# Patient Record
Sex: Female | Born: 1945 | Race: White | Hispanic: No | Marital: Single | Smoking: Never smoker
Health system: Southern US, Community
[De-identification: ages and names within clinical notes are randomized; demographics above are authoritative.]

## PROBLEM LIST (undated history)

## (undated) DIAGNOSIS — C541 Malignant neoplasm of endometrium: Secondary | ICD-10-CM

## (undated) DIAGNOSIS — G629 Polyneuropathy, unspecified: Secondary | ICD-10-CM

## (undated) DIAGNOSIS — H409 Unspecified glaucoma: Secondary | ICD-10-CM

## (undated) DIAGNOSIS — E538 Deficiency of other specified B group vitamins: Secondary | ICD-10-CM

## (undated) DIAGNOSIS — K829 Disease of gallbladder, unspecified: Secondary | ICD-10-CM

## (undated) DIAGNOSIS — H8110 Benign paroxysmal vertigo, unspecified ear: Secondary | ICD-10-CM

## (undated) HISTORY — DX: Malignant neoplasm of endometrium: C54.1

## (undated) HISTORY — DX: Disease of gallbladder, unspecified: K82.9

## (undated) HISTORY — DX: Polyneuropathy, unspecified: G62.9

## (undated) HISTORY — PX: TONSILLECTOMY: SUR1361

## (undated) HISTORY — DX: Unspecified glaucoma: H40.9

## (undated) HISTORY — DX: Benign paroxysmal vertigo, unspecified ear: H81.10

## (undated) HISTORY — PX: OOPHORECTOMY: SHX86

## (undated) HISTORY — DX: Deficiency of other specified B group vitamins: E53.8

---

## 2006-06-23 ENCOUNTER — Ambulatory Visit: Payer: Self-pay | Admitting: Emergency Medicine

## 2008-02-15 DIAGNOSIS — C541 Malignant neoplasm of endometrium: Secondary | ICD-10-CM

## 2008-02-15 HISTORY — PX: CHOLECYSTECTOMY: SHX55

## 2008-02-15 HISTORY — PX: ABDOMINAL HYSTERECTOMY: SHX81

## 2008-02-15 HISTORY — DX: Malignant neoplasm of endometrium: C54.1

## 2008-06-29 ENCOUNTER — Ambulatory Visit: Payer: Self-pay | Admitting: Internal Medicine

## 2008-07-01 ENCOUNTER — Ambulatory Visit: Payer: Self-pay | Admitting: Internal Medicine

## 2008-10-14 ENCOUNTER — Ambulatory Visit: Payer: Self-pay | Admitting: Unknown Physician Specialty

## 2008-10-15 ENCOUNTER — Ambulatory Visit: Payer: Self-pay | Admitting: Gynecologic Oncology

## 2008-10-17 ENCOUNTER — Ambulatory Visit: Payer: Self-pay | Admitting: Unknown Physician Specialty

## 2008-10-22 ENCOUNTER — Ambulatory Visit: Payer: Self-pay | Admitting: Unknown Physician Specialty

## 2008-10-28 ENCOUNTER — Ambulatory Visit: Payer: Self-pay | Admitting: Gynecologic Oncology

## 2008-11-14 ENCOUNTER — Ambulatory Visit: Payer: Self-pay | Admitting: Gynecologic Oncology

## 2008-11-25 ENCOUNTER — Ambulatory Visit: Payer: Self-pay | Admitting: Gynecologic Oncology

## 2008-12-15 ENCOUNTER — Ambulatory Visit: Payer: Self-pay | Admitting: Gynecologic Oncology

## 2009-01-05 ENCOUNTER — Emergency Department: Payer: Self-pay | Admitting: Emergency Medicine

## 2009-01-06 ENCOUNTER — Inpatient Hospital Stay: Payer: Self-pay | Admitting: General Surgery

## 2009-01-06 ENCOUNTER — Ambulatory Visit: Payer: Self-pay | Admitting: Gynecologic Oncology

## 2009-04-09 ENCOUNTER — Ambulatory Visit: Payer: Self-pay | Admitting: Internal Medicine

## 2009-04-13 ENCOUNTER — Ambulatory Visit: Payer: Self-pay | Admitting: Internal Medicine

## 2009-04-14 ENCOUNTER — Ambulatory Visit: Payer: Self-pay | Admitting: Gynecologic Oncology

## 2009-04-23 ENCOUNTER — Ambulatory Visit: Payer: Self-pay | Admitting: Internal Medicine

## 2009-05-05 ENCOUNTER — Ambulatory Visit: Payer: Self-pay | Admitting: Gynecologic Oncology

## 2009-05-15 ENCOUNTER — Ambulatory Visit: Payer: Self-pay | Admitting: Gynecologic Oncology

## 2009-05-15 ENCOUNTER — Ambulatory Visit: Payer: Self-pay | Admitting: Internal Medicine

## 2009-06-03 ENCOUNTER — Ambulatory Visit: Payer: Self-pay | Admitting: Internal Medicine

## 2009-06-10 ENCOUNTER — Ambulatory Visit: Payer: Self-pay | Admitting: Internal Medicine

## 2009-06-17 ENCOUNTER — Ambulatory Visit: Payer: Self-pay | Admitting: Internal Medicine

## 2009-06-24 ENCOUNTER — Ambulatory Visit: Payer: Self-pay | Admitting: Family Medicine

## 2009-12-09 ENCOUNTER — Ambulatory Visit: Payer: Self-pay | Admitting: Internal Medicine

## 2009-12-15 ENCOUNTER — Ambulatory Visit: Payer: Self-pay | Admitting: Internal Medicine

## 2010-01-11 ENCOUNTER — Encounter: Payer: Self-pay | Admitting: Internal Medicine

## 2010-01-14 ENCOUNTER — Encounter: Payer: Self-pay | Admitting: Internal Medicine

## 2010-02-14 ENCOUNTER — Encounter: Payer: Self-pay | Admitting: Internal Medicine

## 2010-04-27 ENCOUNTER — Ambulatory Visit: Payer: Self-pay | Admitting: Internal Medicine

## 2010-05-06 LAB — HM COLONOSCOPY: HM Colonoscopy: NORMAL

## 2010-11-02 ENCOUNTER — Ambulatory Visit: Payer: Self-pay | Admitting: Gynecologic Oncology

## 2010-11-15 ENCOUNTER — Ambulatory Visit: Payer: Self-pay | Admitting: Gynecologic Oncology

## 2010-11-16 ENCOUNTER — Ambulatory Visit: Payer: Self-pay | Admitting: Gastroenterology

## 2010-11-25 ENCOUNTER — Telehealth: Payer: Self-pay | Admitting: Internal Medicine

## 2010-11-25 NOTE — Telephone Encounter (Signed)
Message copied by Virgina Evener on Thu Nov 25, 2010  3:06 PM ------      Message from: Ronna Polio A      Created: Mon Nov 22, 2010 11:52 AM       Pt requested for me to fill out some paperwork. That is fine. She needs to have a PPD first. We can bring her in for this.

## 2010-11-25 NOTE — Telephone Encounter (Signed)
She is coming in on Monday to have her PPD test done.

## 2010-11-29 ENCOUNTER — Ambulatory Visit: Payer: BC Managed Care – PPO

## 2010-12-01 ENCOUNTER — Telehealth: Payer: Self-pay | Admitting: Internal Medicine

## 2010-12-01 NOTE — Telephone Encounter (Signed)
Everything is done.

## 2010-12-01 NOTE — Telephone Encounter (Signed)
Message copied by Virgina Evener on Wed Dec 01, 2010 10:52 AM ------      Message from: Ronna Polio A      Created: Mon Nov 22, 2010 11:52 AM       Pt requested for me to fill out some paperwork. That is fine. She needs to have a PPD first. We can bring her in for this.

## 2010-12-16 ENCOUNTER — Encounter: Payer: Self-pay | Admitting: Internal Medicine

## 2011-02-24 IMAGING — US US PELV - US TRANSVAGINAL
1 series · 17 of 25 positions shown · non-contrast
Comparison: none

REASON FOR EXAM: POST MENOPAUSAL BLEEDING
COMMENTS:

[Series 1: us pelv - us transvaginal · 17 of 58 slices shown]
[im 1/58]
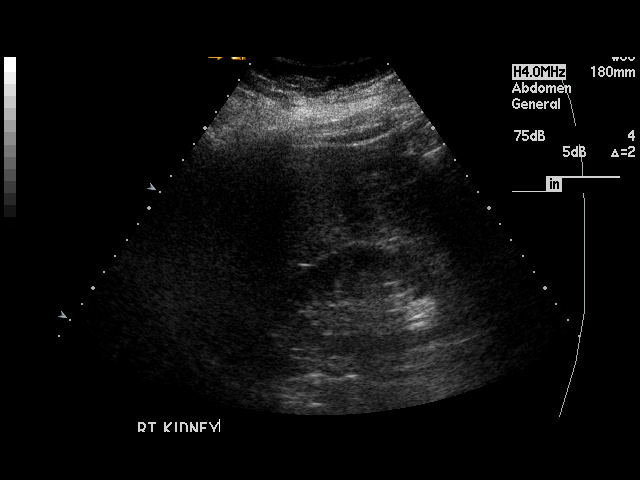
[im 5/58]
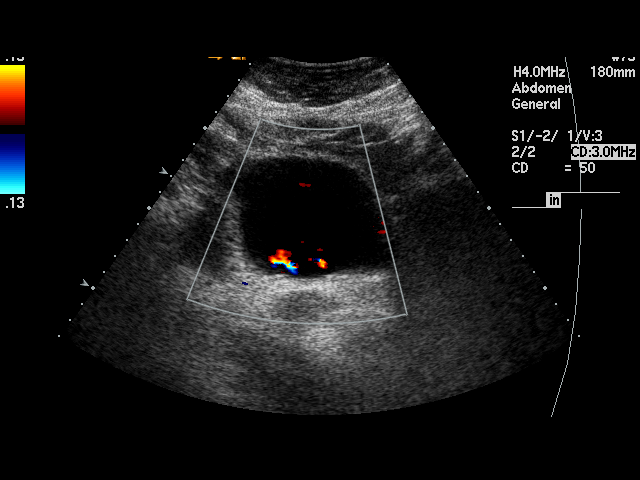
[im 8/58]
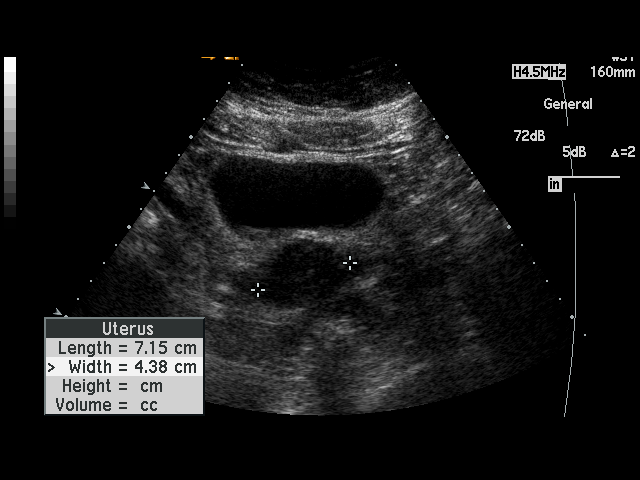
[im 12/58]
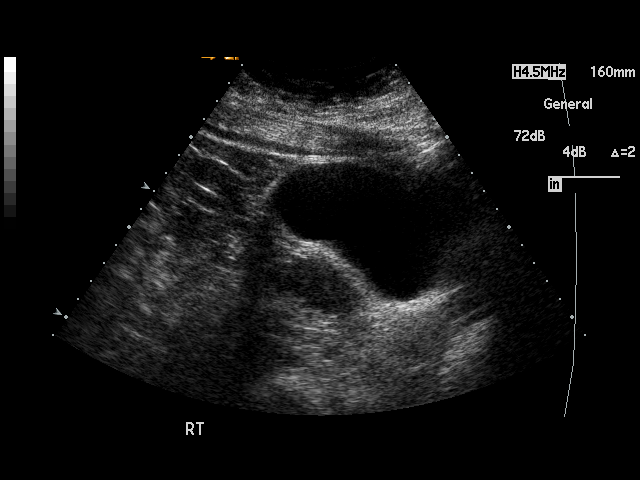
[im 15/58]
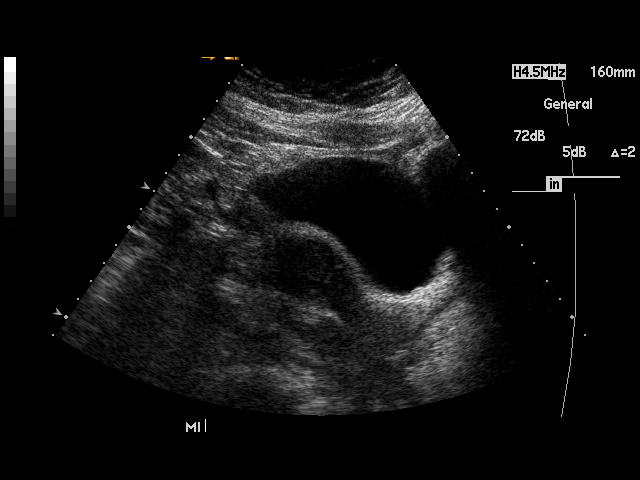
[im 20/58]
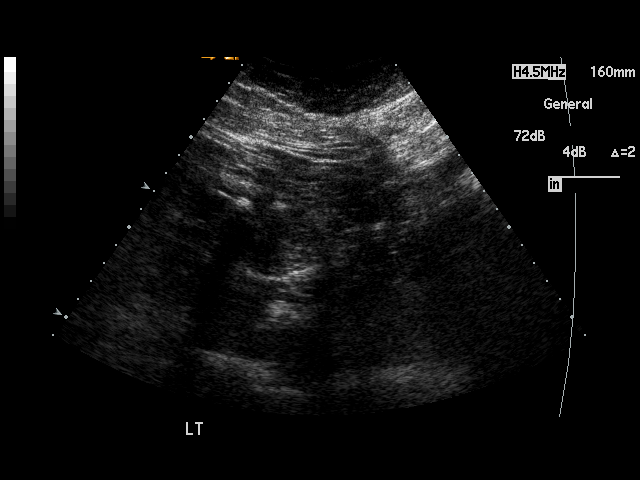
[im 22/58]
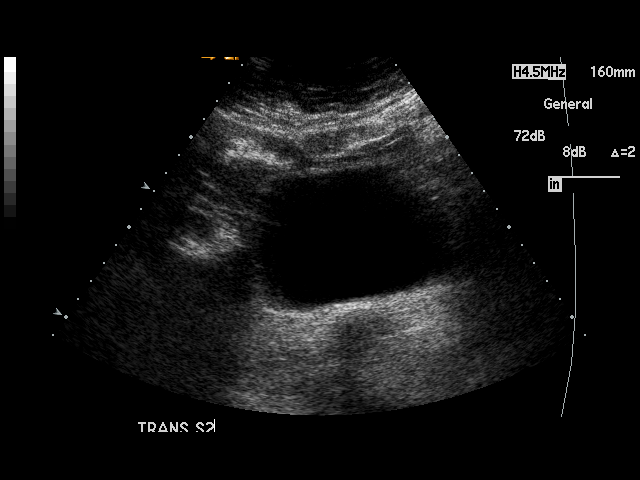
[im 27/58]
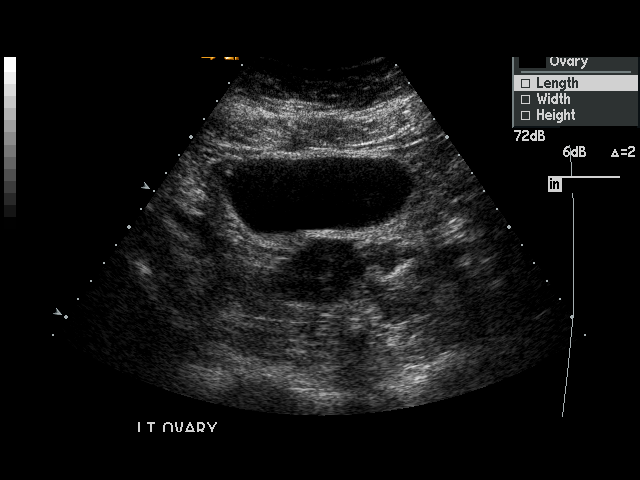
[im 29/58]
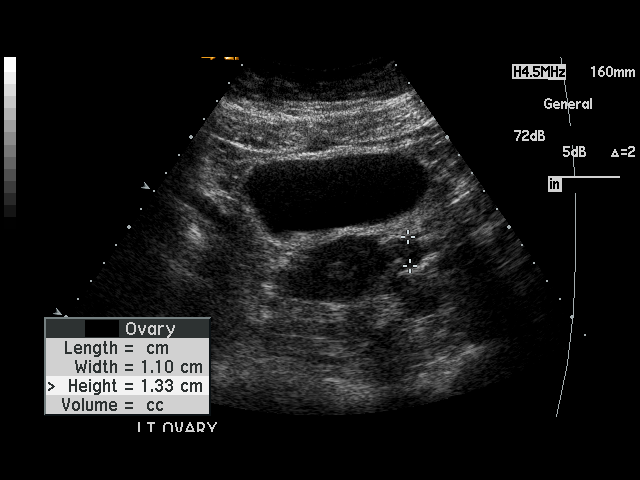
[im 31/58]
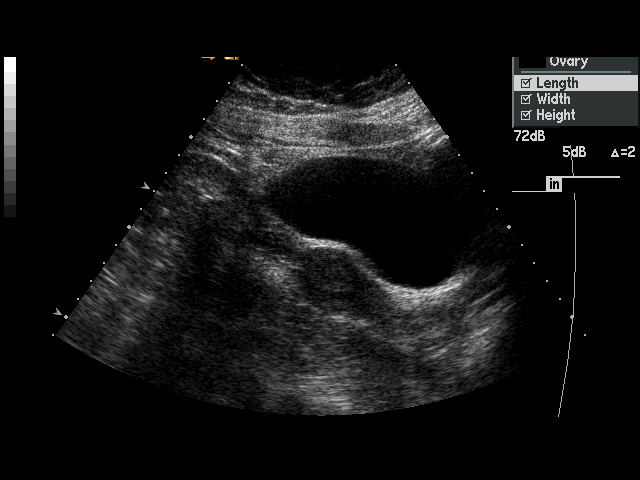
[im 36/58]
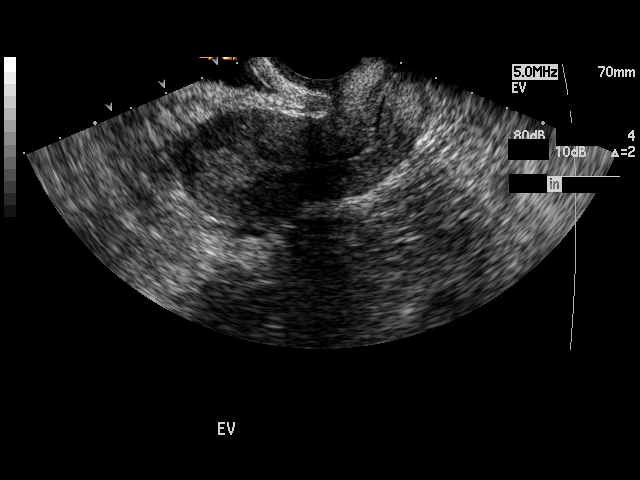
[im 39/58]
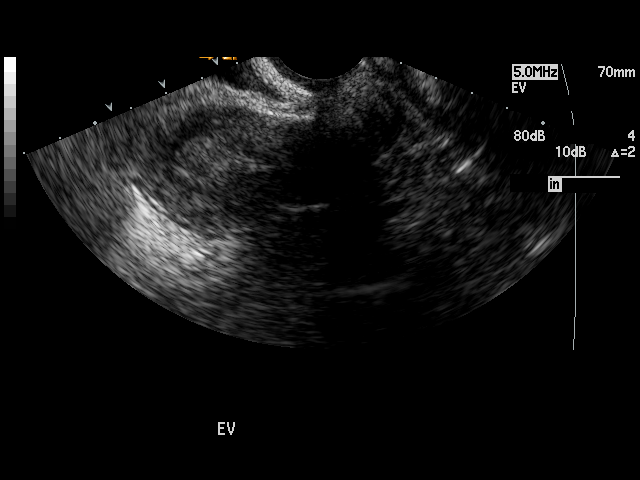
[im 43/58]
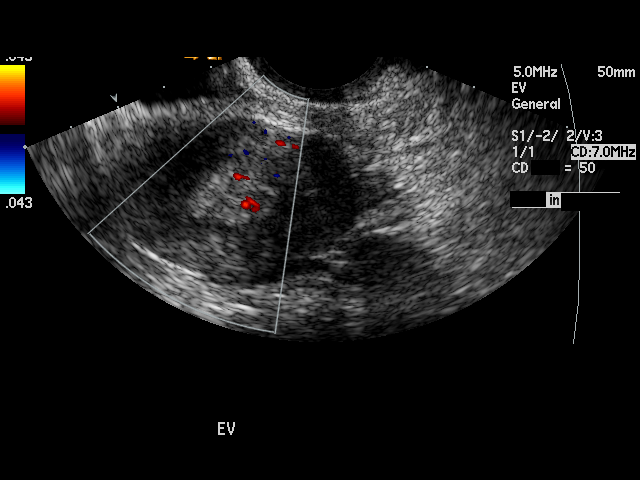
[im 46/58]
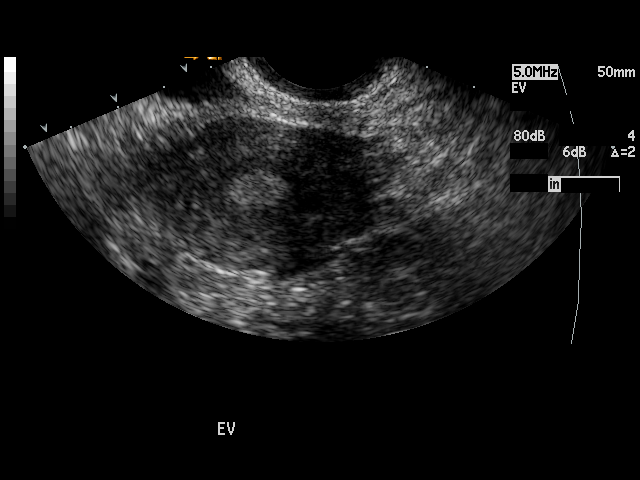
[im 50/58]
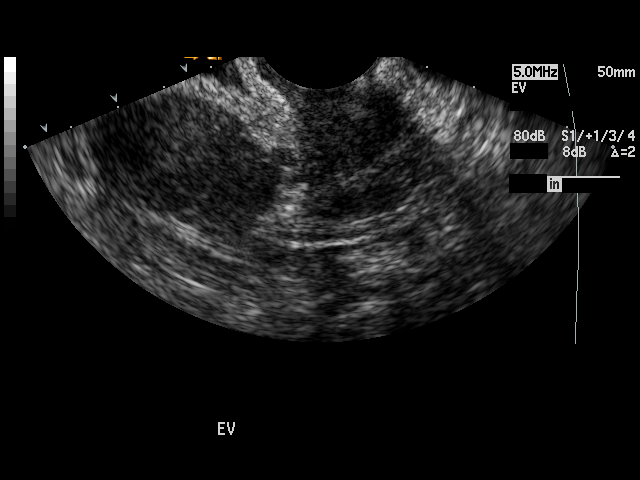
[im 53/58]
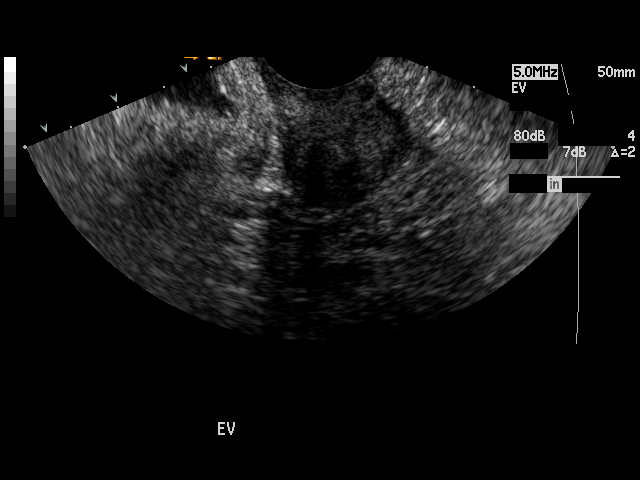
[im 58/58]
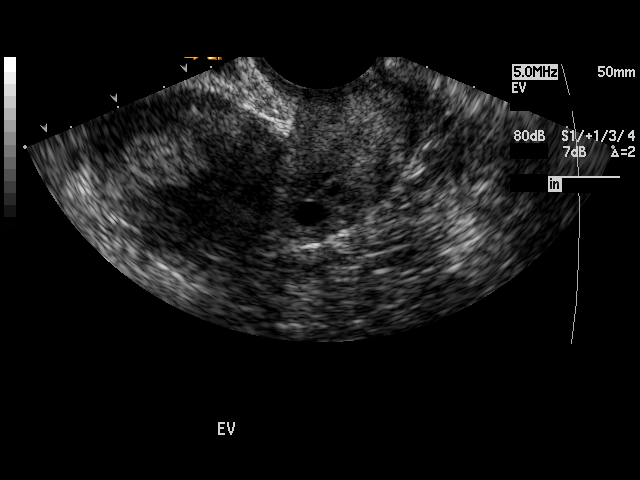

[17 of 25 positions shown; findings below may reference images not displayed]

PROCEDURE:     US  - US PELVIS EXAM W/TRANSVAGINAL  - October 14, 2008  [DATE]

RESULT:     Transabdominal and endovaginal ultrasound was performed. The
uterus measures 7.1 cm x 4.3 cm x 3.2 cm. No uterine mass lesions are seen.
The endometrium measures 1.35 cm in thickness which is increased for the
postmenopausal patient. The left ovary is visualized and measures 1.78 cm at
maximum diameter. The right ovary is not seen and apparently is atrophic or
obscured. No abnormal adnexal masses are seen. No free fluid is identified
in the pelvis. The kidneys show no hydronephrosis. The visualized portion of
the urinary bladder is normal in appearance.
IMPRESSION: 1. No focal uterine mass lesions are seen.
2. The endometrium measures 1.35 cm in thickness which is increased for the
patient's age.
3. The right ovary is not visualized.
4. No abnormal adnexal masses are seen.
5. The kidneys show no hydronephrosis.
6. No free fluid is noted in the pelvis.

## 2011-03-07 ENCOUNTER — Telehealth: Payer: Self-pay | Admitting: Internal Medicine

## 2011-03-07 NOTE — Telephone Encounter (Signed)
[phone] Pt wanted to know if it was time for her mammogram.  She had it done in [place] [employer]

## 2011-03-07 NOTE — Telephone Encounter (Signed)
Patient advised to call [facility] to find out date of her last mammogram. She will call us back if she is due for one.

## 2011-03-22 ENCOUNTER — Telehealth: Payer: Self-pay | Admitting: Internal Medicine

## 2011-03-22 DIAGNOSIS — Z1231 Encounter for screening mammogram for malignant neoplasm of breast: Secondary | ICD-10-CM

## 2011-03-22 NOTE — Telephone Encounter (Signed)
[phone] Pt called she had her last mammogram 04/27/10  [facility]

## 2011-03-23 NOTE — Telephone Encounter (Signed)
Referral entered  

## 2011-04-28 ENCOUNTER — Ambulatory Visit: Payer: Self-pay | Admitting: Internal Medicine

## 2011-04-28 LAB — HM MAMMOGRAPHY

## 2011-05-03 ENCOUNTER — Ambulatory Visit: Payer: Self-pay | Admitting: Gynecologic Oncology

## 2011-05-03 ENCOUNTER — Encounter: Payer: Self-pay | Admitting: Internal Medicine

## 2011-05-06 ENCOUNTER — Encounter: Payer: Self-pay | Admitting: Internal Medicine

## 2011-05-06 ENCOUNTER — Ambulatory Visit (INDEPENDENT_AMBULATORY_CARE_PROVIDER_SITE_OTHER): Payer: BC Managed Care – PPO | Admitting: Internal Medicine

## 2011-05-06 VITALS — BP 132/80 | HR 91 | Temp 98.2°F | Ht 67.0 in | Wt 173.0 lb

## 2011-05-06 DIAGNOSIS — R5383 Other fatigue: Secondary | ICD-10-CM

## 2011-05-06 DIAGNOSIS — R5381 Other malaise: Secondary | ICD-10-CM

## 2011-05-06 DIAGNOSIS — R197 Diarrhea, unspecified: Secondary | ICD-10-CM

## 2011-05-06 DIAGNOSIS — E785 Hyperlipidemia, unspecified: Secondary | ICD-10-CM

## 2011-05-06 LAB — COMPREHENSIVE METABOLIC PANEL
ALT: 11 U/L (ref 0–35)
Albumin: 4.2 g/dL (ref 3.5–5.2)
Alkaline Phosphatase: 89 U/L (ref 39–117)
CO2: 26 mEq/L (ref 19–32)
Potassium: 4.1 mEq/L (ref 3.5–5.3)
Sodium: 140 mEq/L (ref 135–145)
Total Bilirubin: 0.5 mg/dL (ref 0.3–1.2)
Total Protein: 6.4 g/dL (ref 6.0–8.3)

## 2011-05-06 LAB — LIPID PANEL: LDL Cholesterol: 132 mg/dL — ABNORMAL HIGH (ref 0–99)

## 2011-05-06 LAB — TSH: TSH: 1.235 u[IU]/mL (ref 0.350–4.500)

## 2011-05-06 NOTE — Progress Notes (Signed)
Subjective:    Patient ID: [name], female    DOB: 1945-11-21, 66 y.o.   MRN: [id number]  HPI 66 year old female presents for acute visit complaining of a one-week history of watery diarrhea. Initially, she was having several episodes per day. However, over the last 48 hours this has improved. Now she is only having loose stool in the morning. She denies any fever or chills. She denies any abdominal pain. Diarrhea is described as watery, nonbloody. There is occasional mucus with the stool. She also notes some fatigue over the last couple of weeks. Despite her diarrheal illness, she continues to work out aggressively in an effort to lose weight. She is meeting with a personal trainer twice per week for vigorous circuit training sessions. She is following a very healthy diet. Denies dyspnea, chest pain, palpitations, or other symptoms.  Outpatient Encounter Prescriptions as of 05/06/2011  Medication Sig Dispense Refill  . brimonidine-timolol (COMBIGAN) 0.2-0.5 % ophthalmic solution Place 1 drop into both eyes every 12 (twelve) hours.      . Calcium-Vitamin D-Vitamin K (CALCIUM + D + K PO) Take by mouth daily.      . fish oil-omega-3 fatty acids 1000 MG capsule Take 2 g by mouth daily.      . vitamin E 400 UNIT capsule Take 400 Units by mouth daily.        Review of Systems  Constitutional: Positive for fatigue. Negative for fever, chills, appetite change and unexpected weight change.  HENT: Negative for ear pain, congestion, sore throat, trouble swallowing, neck pain, voice change and sinus pressure.   Eyes: Negative for visual disturbance.  Respiratory: Negative for cough, shortness of breath, wheezing and stridor.   Cardiovascular: Negative for chest pain, palpitations and leg swelling.  Gastrointestinal: Positive for diarrhea. Negative for nausea, vomiting, abdominal pain, constipation, blood in stool, abdominal distention and anal bleeding.  Genitourinary: Negative for dysuria and flank  pain.  Musculoskeletal: Negative for myalgias, arthralgias and gait problem.  Skin: Negative for color change and rash.  Neurological: Negative for dizziness and headaches.  Hematological: Negative for adenopathy. Does not bruise/bleed easily.  Psychiatric/Behavioral: Negative for suicidal ideas, sleep disturbance and dysphoric mood. The patient is not nervous/anxious.    BP 132/80  Pulse 91  Temp(Src) 98.2 F (36.8 C) (Oral)  Ht 5\' 7"  (1.702 m)  Wt 173 lb (78.472 kg)  BMI 27.10 kg/m2  SpO2 100%     Objective:   Physical Exam  Constitutional: She is oriented to person, place, and time. She appears well-developed and well-nourished. No distress.  HENT:  Head: Normocephalic and atraumatic.  Right Ear: External ear normal.  Left Ear: External ear normal.  Nose: Nose normal.  Mouth/Throat: Oropharynx is clear and moist. No oropharyngeal exudate.  Eyes: Conjunctivae are normal. Pupils are equal, round, and reactive to light. Right eye exhibits no discharge. Left eye exhibits no discharge. No scleral icterus.  Neck: Normal range of motion. Neck supple. No tracheal deviation present. No thyromegaly present.  Cardiovascular: Normal rate, regular rhythm, normal heart sounds and intact distal pulses.  Exam reveals no gallop and no friction rub.   No murmur heard. Pulmonary/Chest: Effort normal and breath sounds normal. No respiratory distress. She has no wheezes. She has no rales. She exhibits no tenderness.  Abdominal: Soft. Bowel sounds are normal. She exhibits no distension and no mass. There is no tenderness. There is no rebound and no guarding.  Musculoskeletal: Normal range of motion. She exhibits no edema and no  tenderness.  Lymphadenopathy:    She has no cervical adenopathy.  Neurological: She is alert and oriented to person, place, and time. No cranial nerve deficit. She exhibits normal muscle tone. Coordination normal.  Skin: Skin is warm and dry. No rash noted. She is not  diaphoretic. No erythema. No pallor.  Psychiatric: She has a normal mood and affect. Her behavior is normal. Judgment and thought content normal.          Assessment & Plan:

## 2011-05-06 NOTE — Assessment & Plan Note (Signed)
Likely multifactorial with recent diarrheal illness and dehydration. We'll check basic labs including CBC, CMP, TSH. Followup in 3 months.

## 2011-05-06 NOTE — Assessment & Plan Note (Signed)
Pt with diarrhea x 1 week. Now improving. No fever, chills, abdominal pain. Likely viral gastroenteritis. Will continue to monitor. If no improvement over next 48hr, will send stool for culture. Recommended bland diet, avoiding dairy products.  Follow up prn.

## 2011-05-07 ENCOUNTER — Encounter: Payer: Self-pay | Admitting: Internal Medicine

## 2011-05-07 LAB — CBC WITH DIFFERENTIAL/PLATELET
Basophils Absolute: 0 10*3/uL (ref 0.0–0.1)
Basophils Relative: 0 % (ref 0–1)
Eosinophils Absolute: 0.2 10*3/uL (ref 0.0–0.7)
Eosinophils Relative: 2 % (ref 0–5)
MCH: 30.7 pg (ref 26.0–34.0)
MCV: 90.1 fL (ref 78.0–100.0)
Neutrophils Relative %: 57 % (ref 43–77)
Platelets: 255 10*3/uL (ref 150–400)
RDW: 12 % (ref 11.5–15.5)

## 2011-05-16 ENCOUNTER — Ambulatory Visit: Payer: Self-pay | Admitting: Gynecologic Oncology

## 2011-05-16 ENCOUNTER — Encounter: Payer: Self-pay | Admitting: Internal Medicine

## 2011-05-16 DIAGNOSIS — R197 Diarrhea, unspecified: Secondary | ICD-10-CM

## 2011-05-19 ENCOUNTER — Encounter: Payer: Self-pay | Admitting: Internal Medicine

## 2011-05-24 ENCOUNTER — Encounter: Payer: Self-pay | Admitting: Internal Medicine

## 2011-06-16 ENCOUNTER — Encounter: Payer: Self-pay | Admitting: Internal Medicine

## 2011-09-19 ENCOUNTER — Encounter: Payer: Self-pay | Admitting: Internal Medicine

## 2011-09-19 ENCOUNTER — Ambulatory Visit (INDEPENDENT_AMBULATORY_CARE_PROVIDER_SITE_OTHER): Payer: Medicare Other | Admitting: Internal Medicine

## 2011-09-19 VITALS — BP 142/100 | HR 68 | Temp 98.7°F | Ht 67.0 in | Wt 168.5 lb

## 2011-09-19 DIAGNOSIS — D510 Vitamin B12 deficiency anemia due to intrinsic factor deficiency: Secondary | ICD-10-CM

## 2011-09-19 DIAGNOSIS — R42 Dizziness and giddiness: Secondary | ICD-10-CM

## 2011-09-19 LAB — COMPREHENSIVE METABOLIC PANEL
ALT: 18 U/L (ref 0–35)
Alkaline Phosphatase: 89 U/L (ref 39–117)
Creatinine, Ser: 0.8 mg/dL (ref 0.4–1.2)
Glucose, Bld: 96 mg/dL (ref 70–99)
Sodium: 140 mEq/L (ref 135–145)
Total Bilirubin: 0.7 mg/dL (ref 0.3–1.2)
Total Protein: 6.5 g/dL (ref 6.0–8.3)

## 2011-09-19 MED ORDER — MECLIZINE HCL 25 MG PO TABS: 25.0000 mg | ORAL_TABLET | Freq: Three times a day (TID) | ORAL | Status: AC | PRN

## 2011-09-19 NOTE — Assessment & Plan Note (Signed)
Dizziness consistent with patient's previous history of vertigo. Also question whether dehydration may be playing a role given patient's intense exercise schedule. Will check electrolytes and renal function with labs today. We'll also check B12 level. Recent TSH was normal. Encourage patient to increase intake of fluids such as Gatorade after exercising. Will start meclizine to help with symptoms. If symptoms are persistent, will set up vestibular therapy and/or ENT evaluation.

## 2011-09-19 NOTE — Progress Notes (Signed)
Subjective:    Patient ID: [name], female    DOB: 1945/04/27, 66 y.o.   MRN: [id number]  HPI 66 year old female with history of vertigo presents for acute visit complaining of dizziness. She reports that dizziness started yesterday morning. Dizziness is described as spinning sensation. This is consistent with previous episode. In the past, altering head motion with Epley's maneuver helped her symptoms, however during this episode it was not helpful. Patient denies any headache, nausea, vomiting, fever, or other symptoms. She does note that she has significantly increased her exercise activity and questions whether dehydration may be playing a role.  Outpatient Encounter Prescriptions as of 09/19/2011  Medication Sig Dispense Refill  . brimonidine-timolol (COMBIGAN) 0.2-0.5 % ophthalmic solution Place 1 drop into both eyes every 12 (twelve) hours.      . Calcium-Vitamin D-Vitamin K (CALCIUM + D + K PO) Take by mouth daily.      . fish oil-omega-3 fatty acids 1000 MG capsule Take 2 g by mouth daily.      . meclizine (ANTIVERT) 25 MG tablet Take 1 tablet (25 mg total) by mouth 3 (three) times daily as needed.  30 tablet  0  . vitamin E 400 UNIT capsule Take 400 Units by mouth daily.        Review of Systems  Constitutional: Negative for fever, chills, appetite change, fatigue and unexpected weight change.  HENT: Negative for ear pain, congestion, sore throat, trouble swallowing, neck pain, voice change and sinus pressure.   Eyes: Negative for visual disturbance.  Respiratory: Negative for cough, shortness of breath, wheezing and stridor.   Cardiovascular: Negative for chest pain, palpitations and leg swelling.  Gastrointestinal: Negative for nausea, vomiting, abdominal pain, diarrhea, constipation, blood in stool, abdominal distention and anal bleeding.  Genitourinary: Negative for dysuria and flank pain.  Musculoskeletal: Negative for myalgias, arthralgias and gait problem.  Skin:  Negative for color change and rash.  Neurological: Positive for dizziness. Negative for tremors, seizures, syncope, speech difficulty, weakness, light-headedness, numbness and headaches.  Hematological: Negative for adenopathy. Does not bruise/bleed easily.  Psychiatric/Behavioral: Negative for suicidal ideas, disturbed wake/sleep cycle and dysphoric mood. The patient is not nervous/anxious.    BP 142/100  Pulse 68  Temp 98.7 F (37.1 C) (Oral)  Ht 5\' 7"  (1.702 m)  Wt 168 lb 8 oz (76.431 kg)  BMI 26.39 kg/m2  SpO2 97%     Objective:   Physical Exam  Constitutional: She is oriented to person, place, and time. She appears well-developed and well-nourished. No distress.  HENT:  Head: Normocephalic and atraumatic.  Right Ear: Tympanic membrane, external ear and ear canal normal.  Left Ear: Tympanic membrane, external ear and ear canal normal.  Nose: Nose normal.  Mouth/Throat: Oropharynx is clear and moist. No oropharyngeal exudate.  Eyes: Conjunctivae are normal. Pupils are equal, round, and reactive to light. Right eye exhibits no discharge. Left eye exhibits no discharge. No scleral icterus.  Neck: Normal range of motion. Neck supple. No tracheal deviation present. No thyromegaly present.  Cardiovascular: Normal rate, regular rhythm, normal heart sounds and intact distal pulses.  Exam reveals no gallop and no friction rub.   No murmur heard. Pulmonary/Chest: Effort normal and breath sounds normal. No respiratory distress. She has no wheezes. She has no rales. She exhibits no tenderness.  Musculoskeletal: Normal range of motion. She exhibits no edema and no tenderness.  Lymphadenopathy:    She has no cervical adenopathy.  Neurological: She is alert and oriented to person,  place, and time. No cranial nerve deficit. She exhibits normal muscle tone. Coordination normal.  Skin: Skin is warm and dry. No rash noted. She is not diaphoretic. No erythema. No pallor.  Psychiatric: She has a  normal mood and affect. Her behavior is normal. Judgment and thought content normal.          Assessment & Plan:

## 2011-09-20 ENCOUNTER — Encounter: Payer: Self-pay | Admitting: Internal Medicine

## 2011-09-20 LAB — VITAMIN B12: Vitamin B-12: 290 pg/mL (ref 211–911)

## 2011-09-22 ENCOUNTER — Ambulatory Visit (INDEPENDENT_AMBULATORY_CARE_PROVIDER_SITE_OTHER): Payer: Medicare Other | Admitting: Internal Medicine

## 2011-09-22 DIAGNOSIS — E538 Deficiency of other specified B group vitamins: Secondary | ICD-10-CM

## 2011-09-22 MED ORDER — CYANOCOBALAMIN 1000 MCG/ML IJ SOLN
1000.0000 ug | Freq: Once | INTRAMUSCULAR | Status: AC
  Administered 2011-09-22: 1000 ug via INTRAMUSCULAR

## 2011-09-29 ENCOUNTER — Ambulatory Visit: Payer: Medicare Other

## 2011-10-03 ENCOUNTER — Encounter: Payer: Self-pay | Admitting: Internal Medicine

## 2011-10-06 ENCOUNTER — Ambulatory Visit: Payer: Medicare Other

## 2011-11-01 ENCOUNTER — Ambulatory Visit: Payer: Self-pay | Admitting: Gynecologic Oncology

## 2011-11-02 ENCOUNTER — Encounter: Payer: Self-pay | Admitting: Internal Medicine

## 2011-11-10 ENCOUNTER — Ambulatory Visit: Payer: Medicare Other

## 2011-11-15 ENCOUNTER — Ambulatory Visit: Payer: Self-pay | Admitting: Gynecologic Oncology

## 2011-12-02 ENCOUNTER — Ambulatory Visit (INDEPENDENT_AMBULATORY_CARE_PROVIDER_SITE_OTHER): Payer: Medicare Other | Admitting: Internal Medicine

## 2011-12-02 ENCOUNTER — Encounter: Payer: Self-pay | Admitting: Internal Medicine

## 2011-12-02 VITALS — BP 146/90 | HR 72 | Temp 98.1°F | Ht 67.0 in | Wt 171.5 lb

## 2011-12-02 DIAGNOSIS — E785 Hyperlipidemia, unspecified: Secondary | ICD-10-CM

## 2011-12-02 DIAGNOSIS — Z0000 Encounter for general adult medical examination without abnormal findings: Secondary | ICD-10-CM

## 2011-12-02 DIAGNOSIS — Z23 Encounter for immunization: Secondary | ICD-10-CM

## 2011-12-02 DIAGNOSIS — D649 Anemia, unspecified: Secondary | ICD-10-CM

## 2011-12-02 MED ORDER — CYANOCOBALAMIN 1000 MCG/ML IJ SOLN
1000.0000 ug | Freq: Once | INTRAMUSCULAR | Status: AC
  Administered 2011-12-02: 1000 ug via INTRAMUSCULAR

## 2011-12-02 NOTE — Progress Notes (Signed)
Subjective:    Patient ID: [name], female    DOB: 04/27/1945, 66 y.o.   MRN: [id number]  HPI The patient is here for annual Medicare wellness examination and management of other chronic and acute problems.   The risk factors are reflected in the social history.  The roster of all physicians providing medical care to patient - is listed in the Snapshot section of the chart.  Activities of daily living:  The patient is 100% independent in all ADLs: dressing, toileting, feeding as well as independent mobility  Home safety : The patient has smoke detectors in the home. They wear seatbelts.  There are no firearms at home. There is no violence in the home.   There is no risks for hepatitis, STDs or HIV. There is no history of blood transfusion. They have no travel history to infectious disease endemic areas of the world.  The patient has seen their dentist in the last six month. (Dr. Malen Gauze) They have seen their eye doctor in the last year. (Dr. Earleen Reaper) Hearing testing in the past, wears hearing aids bilaterally.  They have deferred audiologic testing in the last year.   Dermatologist - Dr. Haywood Lasso. They do not  have excessive sun exposure. Discussed the need for sun protection: hats, long sleeves and use of sunscreen if there is significant sun exposure.   Diet: the importance of a healthy diet is discussed. They do have a very healthy diet.  The benefits of regular aerobic exercise were discussed. She works out with a Systems analyst and by herself several times per week.  Depression screen: there are no signs or vegative symptoms of depression- irritability, change in appetite, anhedonia, sadness/tearfullness.  Cognitive assessment: the patient manages all their financial and personal affairs and is actively engaged. They could relate day,date,year and events.  HCPOA - brother, Pricillia Pondexter.  Living will in place.  The following portions of the patient's history  were reviewed and updated as appropriate: allergies, current medications, past family history, past medical history,  past surgical history, past social history  and problem list.  Visual acuity was not assessed per patient preference since she has regular follow up with her ophthalmologist. Hearing and body mass index were assessed and reviewed.   During the course of the visit the patient was educated and counseled about appropriate screening and preventive services including : fall prevention , diabetes screening, nutrition counseling, colorectal cancer screening, and recommended immunizations.     Outpatient Encounter Prescriptions as of 12/02/2011  Medication Sig Dispense Refill  . brimonidine-timolol (COMBIGAN) 0.2-0.5 % ophthalmic solution Place 1 drop into both eyes every 12 (twelve) hours.      . Calcium-Vitamin D-Vitamin K (CALCIUM + D + K PO) Take by mouth daily.      . fish oil-omega-3 fatty acids 1000 MG capsule Take 2 g by mouth daily.      . vitamin E 400 UNIT capsule Take 400 Units by mouth daily.       Facility-Administered Encounter Medications as of 12/02/2011  Medication Dose Route Frequency Provider Last Rate Last Dose  . cyanocobalamin ((VITAMIN B-12)) injection 1,000 mcg  1,000 mcg Intramuscular Once Shelia Media, MD   1,000 mcg at 12/02/11 1645   BP 146/90  Pulse 72  Temp 98.1 F (36.7 C) (Oral)  Ht 5\' 7"  (1.702 m)  Wt 171 lb 8 oz (77.792 kg)  BMI 26.86 kg/m2  SpO2 99%  Review of Systems  Constitutional: Negative for fever,  chills, appetite change, fatigue and unexpected weight change.  HENT: Negative for ear pain, congestion, sore throat, trouble swallowing, neck pain, voice change and sinus pressure.   Eyes: Negative for visual disturbance.  Respiratory: Negative for cough, shortness of breath, wheezing and stridor.   Cardiovascular: Negative for chest pain, palpitations and leg swelling.  Gastrointestinal: Negative for nausea, vomiting, abdominal pain,  diarrhea, constipation, blood in stool, abdominal distention and anal bleeding.  Genitourinary: Negative for dysuria and flank pain.  Musculoskeletal: Negative for myalgias, arthralgias and gait problem.  Skin: Negative for color change and rash.  Neurological: Negative for dizziness and headaches.  Hematological: Negative for adenopathy. Does not bruise/bleed easily.  Psychiatric/Behavioral: Negative for suicidal ideas, disturbed wake/sleep cycle and dysphoric mood. The patient is not nervous/anxious.        Objective:   Physical Exam  Constitutional: She is oriented to person, place, and time. She appears well-developed and well-nourished. No distress.  HENT:  Head: Normocephalic and atraumatic.  Right Ear: External ear normal.  Left Ear: External ear normal.  Nose: Nose normal.  Mouth/Throat: Oropharynx is clear and moist. No oropharyngeal exudate.  Eyes: Conjunctivae normal are normal. Pupils are equal, round, and reactive to light. Right eye exhibits no discharge. Left eye exhibits no discharge. No scleral icterus.  Neck: Normal range of motion. Neck supple. No tracheal deviation present. No thyromegaly present.  Cardiovascular: Normal rate, regular rhythm, normal heart sounds and intact distal pulses.  Exam reveals no gallop and no friction rub.   No murmur heard. Pulmonary/Chest: Effort normal and breath sounds normal. No respiratory distress. She has no wheezes. She has no rales. She exhibits no tenderness.  Abdominal: Soft. Bowel sounds are normal. She exhibits no distension and no mass. There is no tenderness. There is no rebound and no guarding.  Musculoskeletal: Normal range of motion. She exhibits no edema and no tenderness.  Lymphadenopathy:    She has no cervical adenopathy.  Neurological: She is alert and oriented to person, place, and time. No cranial nerve deficit. She exhibits normal muscle tone. Coordination normal.  Skin: Skin is warm and dry. No rash noted. She is  not diaphoretic. No erythema. No pallor.  Psychiatric: She has a normal mood and affect. Her behavior is normal. Judgment and thought content normal.          Assessment & Plan:

## 2011-12-02 NOTE — Assessment & Plan Note (Signed)
General exam normal today. Patient is up-to-date on health maintenance including mammogram, colonoscopy. Patient is up-to-date on vaccinations except for influenza which she declines today. Pneumococcal vaccine given today. Appropriate screening performed. Encouraged patient to keep up efforts at healthy diet and regular physical activity. Will check labs today including CBC, CMP, lipid profile. Followup in 6 months or sooner as needed.

## 2011-12-03 LAB — LIPID PANEL
Cholesterol: 178 mg/dL (ref 0–200)
HDL: 57 mg/dL (ref >39)
Total CHOL/HDL Ratio: 3.1 Ratio
Triglycerides: 102 mg/dL (ref <150)
VLDL: 20 mg/dL (ref 0–40)

## 2011-12-03 LAB — CBC WITH DIFFERENTIAL/PLATELET
Basophils Absolute: 0 10*3/uL (ref 0.0–0.1)
Basophils Relative: 0 % (ref 0–1)
Eosinophils Absolute: 0.1 10*3/uL (ref 0.0–0.7)
Eosinophils Relative: 2 % (ref 0–5)
Lymphs Abs: 2 10*3/uL (ref 0.7–4.0)
MCH: 31.3 pg (ref 26.0–34.0)
MCHC: 35.2 g/dL (ref 30.0–36.0)
MCV: 88.8 fL (ref 78.0–100.0)
Neutrophils Relative %: 56 % (ref 43–77)
Platelets: 253 10*3/uL (ref 150–400)
RDW: 13 % (ref 11.5–15.5)

## 2011-12-03 LAB — COMPREHENSIVE METABOLIC PANEL
BUN: 16 mg/dL (ref 6–23)
CO2: 28 mEq/L (ref 19–32)
Calcium: 9 mg/dL (ref 8.4–10.5)
Chloride: 106 mEq/L (ref 96–112)
Creat: 0.68 mg/dL (ref 0.50–1.10)
Total Bilirubin: 0.6 mg/dL (ref 0.3–1.2)

## 2011-12-04 ENCOUNTER — Encounter: Payer: Self-pay | Admitting: Internal Medicine

## 2012-01-04 ENCOUNTER — Ambulatory Visit (INDEPENDENT_AMBULATORY_CARE_PROVIDER_SITE_OTHER): Payer: Medicare Other | Admitting: *Deleted

## 2012-01-04 DIAGNOSIS — E538 Deficiency of other specified B group vitamins: Secondary | ICD-10-CM

## 2012-01-04 MED ORDER — CYANOCOBALAMIN 1000 MCG/ML IJ SOLN
1000.0000 ug | Freq: Once | INTRAMUSCULAR | Status: AC
  Administered 2012-01-04: 1000 ug via INTRAMUSCULAR

## 2012-01-04 MED ORDER — CYANOCOBALAMIN 1000 MCG/ML IJ SOLN
1000.0000 ug | Freq: Once | INTRAMUSCULAR | Status: AC
  Administered 2014-04-01: 1000 ug via INTRAMUSCULAR

## 2012-01-26 ENCOUNTER — Telehealth: Payer: Self-pay | Admitting: Internal Medicine

## 2012-01-26 NOTE — Telephone Encounter (Signed)
Please call or email pt when b12 comes in

## 2012-02-01 NOTE — Telephone Encounter (Signed)
Schedule for B12 

## 2012-02-06 ENCOUNTER — Ambulatory Visit: Payer: Medicare Other

## 2012-02-23 ENCOUNTER — Encounter: Payer: Self-pay | Admitting: Internal Medicine

## 2012-02-28 ENCOUNTER — Ambulatory Visit (INDEPENDENT_AMBULATORY_CARE_PROVIDER_SITE_OTHER): Payer: 59 | Admitting: *Deleted

## 2012-02-28 DIAGNOSIS — E538 Deficiency of other specified B group vitamins: Secondary | ICD-10-CM

## 2012-02-28 MED ORDER — CYANOCOBALAMIN 1000 MCG/ML IJ SOLN
1000.0000 ug | Freq: Once | INTRAMUSCULAR | Status: AC
  Administered 2012-02-28: 1000 ug via INTRAMUSCULAR

## 2012-03-31 ENCOUNTER — Other Ambulatory Visit: Payer: Self-pay

## 2012-04-03 ENCOUNTER — Ambulatory Visit: Payer: 59

## 2012-04-10 ENCOUNTER — Ambulatory Visit (INDEPENDENT_AMBULATORY_CARE_PROVIDER_SITE_OTHER): Payer: 59 | Admitting: *Deleted

## 2012-04-10 ENCOUNTER — Telehealth: Payer: Self-pay | Admitting: Internal Medicine

## 2012-04-10 ENCOUNTER — Ambulatory Visit: Payer: 59

## 2012-04-10 DIAGNOSIS — Z1231 Encounter for screening mammogram for malignant neoplasm of breast: Secondary | ICD-10-CM

## 2012-04-10 DIAGNOSIS — E538 Deficiency of other specified B group vitamins: Secondary | ICD-10-CM

## 2012-04-10 MED ORDER — CYANOCOBALAMIN 1000 MCG/ML IJ SOLN
1000.0000 ug | Freq: Once | INTRAMUSCULAR | Status: AC
  Administered 2012-04-10: 1000 ug via INTRAMUSCULAR

## 2012-04-10 NOTE — Telephone Encounter (Signed)
Pt came in wanting to get an order for mammogram [place]

## 2012-04-10 NOTE — Telephone Encounter (Signed)
Order placed

## 2012-04-10 NOTE — Telephone Encounter (Signed)
Please read below, patient need referral to have mammogram done.

## 2012-04-12 NOTE — Telephone Encounter (Signed)
Left message on patient cell phone voicemail informing her order has been placed.

## 2012-04-19 ENCOUNTER — Telehealth: Payer: Self-pay | Admitting: Emergency Medicine

## 2012-05-01 ENCOUNTER — Ambulatory Visit: Payer: Self-pay | Admitting: Internal Medicine

## 2012-05-03 ENCOUNTER — Encounter: Payer: Self-pay | Admitting: Internal Medicine

## 2012-05-04 ENCOUNTER — Encounter: Payer: Self-pay | Admitting: Internal Medicine

## 2012-05-04 ENCOUNTER — Ambulatory Visit (INDEPENDENT_AMBULATORY_CARE_PROVIDER_SITE_OTHER): Payer: 59 | Admitting: Internal Medicine

## 2012-05-04 VITALS — BP 130/94 | HR 75 | Temp 98.2°F | Wt 169.0 lb

## 2012-05-04 DIAGNOSIS — H8110 Benign paroxysmal vertigo, unspecified ear: Secondary | ICD-10-CM | POA: Insufficient documentation

## 2012-05-04 MED ORDER — MECLIZINE HCL 25 MG PO TABS: 25.0000 mg | ORAL_TABLET | Freq: Three times a day (TID) | ORAL | Status: DC | PRN

## 2012-05-04 NOTE — Assessment & Plan Note (Signed)
Symptoms are consistent with benign positional vertigo. Symptoms have gradually improved over the last 24 hours. Will continue to monitor for now. Will use meclizine as needed. Will set up vestibular therapy as this significantly improve symptoms in the past. Patient will call his symptoms are persistent or if new symptoms develop.

## 2012-05-04 NOTE — Patient Instructions (Signed)
Vertigo Vertigo means you feel like you or your surroundings are moving when they are not. Vertigo can be dangerous if it occurs when you are at work, driving, or performing difficult activities.  CAUSES  Vertigo occurs when there is a conflict of signals sent to your brain from the visual and sensory systems in your body. There are many different causes of vertigo, including:  Infections, especially in the inner ear.  A bad reaction to a drug or misuse of alcohol and medicines.  Withdrawal from drugs or alcohol.  Rapidly changing positions, such as lying down or rolling over in bed.  A migraine headache.  Decreased blood flow to the brain.  Increased pressure in the brain from a head injury, infection, tumor, or bleeding. SYMPTOMS  You may feel as though the world is spinning around or you are falling to the ground. Because your balance is upset, vertigo can cause nausea and vomiting. You may have involuntary eye movements (nystagmus). DIAGNOSIS  Vertigo is usually diagnosed by physical exam. If the cause of your vertigo is unknown, your caregiver may perform imaging tests, such as an MRI scan (magnetic resonance imaging). TREATMENT  Most cases of vertigo resolve on their own, without treatment. Depending on the cause, your caregiver may prescribe certain medicines. If your vertigo is related to body position issues, your caregiver may recommend movements or procedures to correct the problem. In rare cases, if your vertigo is caused by certain inner ear problems, you may need surgery. HOME CARE INSTRUCTIONS   Follow your caregiver's instructions.  Avoid driving.  Avoid operating heavy machinery.  Avoid performing any tasks that would be dangerous to you or others during a vertigo episode.  Tell your caregiver if you notice that certain medicines seem to be causing your vertigo. Some of the medicines used to treat vertigo episodes can actually make them worse in some people. SEEK  IMMEDIATE MEDICAL CARE IF:   Your medicines do not relieve your vertigo or are making it worse.  You develop problems with talking, walking, weakness, or using your arms, hands, or legs.  You develop severe headaches.  Your nausea or vomiting continues or gets worse.  You develop visual changes.  A family member notices behavioral changes.  Your condition gets worse. MAKE SURE YOU:  Understand these instructions.  Will watch your condition.  Will get help right away if you are not doing well or get worse. Document Released: 11/10/2004 Document Revised: 04/25/2011 Document Reviewed: 08/19/2010 [employer] Patient Information 2013 [employer].  

## 2012-05-04 NOTE — Progress Notes (Signed)
Subjective:    Patient ID: [name], female    DOB: [birth year], 67 y.o.   MRN: [id number]  HPI 67 year old female presents for acute visit complaining of dizziness. Dizziness first began 2 days ago which she was taking a yoga class. Her head was leaning to the left sided she developed sudden onset of vertigo. This has happened to her in the past and she previously went through vestibular therapy for benign positional vertigo. She had some improvement with this. Over the last 24 hours she has taken some meclizine with improvement in her symptoms. She denies any other symptoms such as headache, nausea, shortness of breath, chest pain, palpitations. She is generally been feeling well. She is participating in her right wrist exercise program for the local hospital. She denies any recent exercise intolerance.  Outpatient Encounter Prescriptions as of 05/04/2012  Medication Sig Dispense Refill  . brimonidine-timolol (COMBIGAN) 0.2-0.5 % ophthalmic solution Place 1 drop into both eyes every 12 (twelve) hours.      . Calcium-Vitamin D-Vitamin K (CALCIUM + D + K PO) Take by mouth daily.      . fish oil-omega-3 fatty acids 1000 MG capsule Take 2 g by mouth daily.      . vitamin E 400 UNIT capsule Take 400 Units by mouth daily.      . meclizine (ANTIVERT) 25 MG tablet Take 1 tablet (25 mg total) by mouth 3 (three) times daily as needed.  30 tablet  0   Facility-Administered Encounter Medications as of 05/04/2012  Medication Dose Route Frequency Provider Last Rate Last Dose  . cyanocobalamin ((VITAMIN B-12)) injection 1,000 mcg  1,000 mcg Intramuscular Once Shelia Media, MD       BP 130/94  Pulse 75  Temp(Src) 98.2 F (36.8 C) (Oral)  Wt 169 lb (76.658 kg)  BMI 26.46 kg/m2  SpO2 97%  Review of Systems  Constitutional: Negative for fever, chills, appetite change, fatigue and unexpected weight change.  HENT: Negative for ear pain, congestion, sore throat, trouble swallowing, neck pain, voice  change and sinus pressure.   Eyes: Negative for visual disturbance.  Respiratory: Negative for cough, shortness of breath, wheezing and stridor.   Cardiovascular: Negative for chest pain, palpitations and leg swelling.  Gastrointestinal: Negative for nausea, vomiting, abdominal pain, diarrhea, constipation, blood in stool, abdominal distention and anal bleeding.  Genitourinary: Negative for dysuria and flank pain.  Musculoskeletal: Negative for myalgias, arthralgias and gait problem.  Skin: Negative for color change and rash.  Neurological: Positive for dizziness. Negative for headaches.  Hematological: Negative for adenopathy. Does not bruise/bleed easily.  Psychiatric/Behavioral: Negative for suicidal ideas, sleep disturbance and dysphoric mood. The patient is not nervous/anxious.        Objective:   Physical Exam  Constitutional: She is oriented to person, place, and time. She appears well-developed and well-nourished. No distress.  HENT:  Head: Normocephalic and atraumatic.  Right Ear: External ear normal.  Left Ear: External ear normal.  Nose: Nose normal.  Mouth/Throat: Oropharynx is clear and moist. No oropharyngeal exudate.  Eyes: Conjunctivae are normal. Pupils are equal, round, and reactive to light. Right eye exhibits no discharge. Left eye exhibits no discharge. No scleral icterus.  Neck: Normal range of motion. Neck supple. No tracheal deviation present. No thyromegaly present.  Cardiovascular: Normal rate, regular rhythm, normal heart sounds and intact distal pulses.  Exam reveals no gallop and no friction rub.   No murmur heard. Pulmonary/Chest: Effort normal and breath sounds normal. No respiratory  distress. She has no wheezes. She has no rales. She exhibits no tenderness.  Musculoskeletal: Normal range of motion. She exhibits no edema and no tenderness.  Lymphadenopathy:    She has no cervical adenopathy.  Neurological: She is alert and oriented to person, place, and  time. No cranial nerve deficit. She exhibits normal muscle tone. Coordination normal.  Skin: Skin is warm and dry. No rash noted. She is not diaphoretic. No erythema. No pallor.  Psychiatric: She has a normal mood and affect. Her behavior is normal. Judgment and thought content normal.          Assessment & Plan:

## 2012-05-21 ENCOUNTER — Encounter: Payer: Self-pay | Admitting: Internal Medicine

## 2012-06-04 ENCOUNTER — Ambulatory Visit: Payer: Medicare Other | Admitting: Internal Medicine

## 2012-06-14 ENCOUNTER — Ambulatory Visit (INDEPENDENT_AMBULATORY_CARE_PROVIDER_SITE_OTHER): Payer: 59 | Admitting: Internal Medicine

## 2012-06-14 ENCOUNTER — Encounter: Payer: Self-pay | Admitting: Internal Medicine

## 2012-06-14 VITALS — BP 118/80 | HR 58 | Temp 98.2°F | Wt 165.0 lb

## 2012-06-14 DIAGNOSIS — H8110 Benign paroxysmal vertigo, unspecified ear: Secondary | ICD-10-CM

## 2012-06-14 NOTE — Progress Notes (Signed)
Subjective:    Patient ID: [name], female    DOB: [birth year], 67 y.o.   MRN: [id number]  HPI 67 year old female with history of benign positional vertigo and obesity presents for followup. She continues to follow a healthy diet and work out regularly with a Systems analyst. She has lost another 4 pounds. She reports she is generally feeling well. In the interim since her last visit she had one episode of vertigo which resolved with use of meclizine. She denies any new concerns today.  Outpatient Encounter Prescriptions as of 06/14/2012  Medication Sig Dispense Refill  . brimonidine-timolol (COMBIGAN) 0.2-0.5 % ophthalmic solution Place 1 drop into both eyes every 12 (twelve) hours.      . meclizine (ANTIVERT) 25 MG tablet Take 1 tablet (25 mg total) by mouth 3 (three) times daily as needed.  30 tablet  0  . Calcium-Vitamin D-Vitamin K (CALCIUM + D + K PO) Take by mouth daily.      . fish oil-omega-3 fatty acids 1000 MG capsule Take 2 g by mouth daily.      . vitamin E 400 UNIT capsule Take 400 Units by mouth daily.        BP 118/80  Pulse 58  Temp(Src) 98.2 F (36.8 C) (Oral)  Wt 165 lb (74.844 kg)  BMI 25.84 kg/m2  SpO2 96%  Review of Systems  Constitutional: Negative for fever, chills, appetite change, fatigue and unexpected weight change.  HENT: Negative for ear pain, congestion, sore throat, trouble swallowing, neck pain, voice change and sinus pressure.   Eyes: Negative for visual disturbance.  Respiratory: Negative for cough, shortness of breath, wheezing and stridor.   Cardiovascular: Negative for chest pain, palpitations and leg swelling.  Gastrointestinal: Negative for nausea, vomiting, abdominal pain, diarrhea, constipation, blood in stool, abdominal distention and anal bleeding.  Genitourinary: Negative for dysuria and flank pain.  Musculoskeletal: Negative for myalgias, arthralgias and gait problem.  Skin: Negative for color change and rash.  Neurological: Negative  for dizziness and headaches.  Hematological: Negative for adenopathy. Does not bruise/bleed easily.  Psychiatric/Behavioral: Negative for suicidal ideas, sleep disturbance and dysphoric mood. The patient is not nervous/anxious.        Objective:   Physical Exam  Constitutional: She is oriented to person, place, and time. She appears well-developed and well-nourished. No distress.  HENT:  Head: Normocephalic and atraumatic.  Right Ear: External ear normal.  Left Ear: External ear normal.  Nose: Nose normal.  Mouth/Throat: Oropharynx is clear and moist. No oropharyngeal exudate.  Eyes: Conjunctivae are normal. Pupils are equal, round, and reactive to light. Right eye exhibits no discharge. Left eye exhibits no discharge. No scleral icterus.  Neck: Normal range of motion. Neck supple. No tracheal deviation present. No thyromegaly present.  Cardiovascular: Normal rate, regular rhythm, normal heart sounds and intact distal pulses.  Exam reveals no gallop and no friction rub.   No murmur heard. Pulmonary/Chest: Effort normal and breath sounds normal. No respiratory distress. She has no wheezes. She has no rales. She exhibits no tenderness.  Musculoskeletal: Normal range of motion. She exhibits no edema and no tenderness.  Lymphadenopathy:    She has no cervical adenopathy.  Neurological: She is alert and oriented to person, place, and time. No cranial nerve deficit. She exhibits normal muscle tone. Coordination normal.  Skin: Skin is warm and dry. No rash noted. She is not diaphoretic. No erythema. No pallor.  Psychiatric: She has a normal mood and affect. Her behavior is  normal. Judgment and thought content normal.          Assessment & Plan:

## 2012-06-14 NOTE — Assessment & Plan Note (Signed)
Symptoms well controlled with intermittent use of Meclizine. Will continue to monitor.

## 2012-07-18 ENCOUNTER — Encounter: Payer: Self-pay | Admitting: Internal Medicine

## 2012-07-25 ENCOUNTER — Ambulatory Visit (INDEPENDENT_AMBULATORY_CARE_PROVIDER_SITE_OTHER): Payer: 59 | Admitting: *Deleted

## 2012-07-25 DIAGNOSIS — E538 Deficiency of other specified B group vitamins: Secondary | ICD-10-CM

## 2012-07-25 MED ORDER — CYANOCOBALAMIN 1000 MCG/ML IJ SOLN
1000.0000 ug | Freq: Once | INTRAMUSCULAR | Status: AC
  Administered 2012-07-25: 1000 ug via INTRAMUSCULAR

## 2012-08-24 ENCOUNTER — Ambulatory Visit (INDEPENDENT_AMBULATORY_CARE_PROVIDER_SITE_OTHER): Payer: 59 | Admitting: *Deleted

## 2012-08-24 DIAGNOSIS — E538 Deficiency of other specified B group vitamins: Secondary | ICD-10-CM

## 2012-08-24 MED ORDER — CYANOCOBALAMIN 1000 MCG/ML IJ SOLN
1000.0000 ug | Freq: Once | INTRAMUSCULAR | Status: AC
  Administered 2012-08-24: 1000 ug via INTRAMUSCULAR

## 2012-09-07 ENCOUNTER — Encounter: Payer: Self-pay | Admitting: Internal Medicine

## 2012-09-11 ENCOUNTER — Ambulatory Visit (INDEPENDENT_AMBULATORY_CARE_PROVIDER_SITE_OTHER): Payer: 59 | Admitting: Internal Medicine

## 2012-09-11 ENCOUNTER — Ambulatory Visit (INDEPENDENT_AMBULATORY_CARE_PROVIDER_SITE_OTHER)
Admission: RE | Admit: 2012-09-11 | Discharge: 2012-09-11 | Disposition: A | Discharge status: Home / Self Care | Payer: Medicare Other | Source: Ambulatory Visit | Attending: Internal Medicine | Admitting: Internal Medicine

## 2012-09-11 ENCOUNTER — Encounter: Payer: Self-pay | Admitting: Emergency Medicine

## 2012-09-11 ENCOUNTER — Encounter: Payer: Self-pay | Admitting: Internal Medicine

## 2012-09-11 VITALS — BP 108/78 | HR 60 | Temp 98.7°F | Wt 165.0 lb

## 2012-09-11 DIAGNOSIS — M25559 Pain in unspecified hip: Secondary | ICD-10-CM

## 2012-09-11 DIAGNOSIS — M25551 Pain in right hip: Secondary | ICD-10-CM

## 2012-09-11 NOTE — Progress Notes (Signed)
  Subjective:    Patient ID: [name], female    DOB: 07-15-1945, 67 y.o.   MRN: [id number]  HPI 67YO female presents for acute visit complaining of over 1 week of right lower back/hip pain. Pain occurs only with activity such as running or aerobics. No pain at rest. Pain described as aching that does not radiate. Taking Ibuprofen with no improvement. Limiting activity to walking or non-impact activities. No weakness, numbness noted.  Outpatient Encounter Prescriptions as of 09/11/2012  Medication Sig Dispense Refill  . brimonidine-timolol (COMBIGAN) 0.2-0.5 % ophthalmic solution Place 1 drop into both eyes every 12 (twelve) hours.      . Calcium-Vitamin D-Vitamin K (CALCIUM + D + K PO) Take by mouth daily.      . fish oil-omega-3 fatty acids 1000 MG capsule Take 2 g by mouth daily.      . vitamin E 400 UNIT capsule Take 400 Units by mouth daily.      . meclizine (ANTIVERT) 25 MG tablet Take 1 tablet (25 mg total) by mouth 3 (three) times daily as needed.  30 tablet  0    BP 108/78  Pulse 60  Temp(Src) 98.7 F (37.1 C) (Oral)  Wt 165 lb (74.844 kg)  BMI 25.84 kg/m2  SpO2 95%  Review of Systems  Constitutional: Negative for fever, chills and fatigue.  Respiratory: Negative for shortness of breath.   Cardiovascular: Negative for chest pain and leg swelling.  Musculoskeletal: Positive for myalgias, back pain and arthralgias. Negative for joint swelling and gait problem.       Objective:   Physical Exam  Constitutional: She is oriented to person, place, and time. She appears well-developed and well-nourished. No distress.  HENT:  Head: Normocephalic and atraumatic.  Right Ear: External ear normal.  Left Ear: External ear normal.  Nose: Nose normal.  Mouth/Throat: Oropharynx is clear and moist.  Eyes: Conjunctivae are normal. Pupils are equal, round, and reactive to light. Right eye exhibits no discharge. Left eye exhibits no discharge. No scleral icterus.  Neck: Normal range  of motion. Neck supple. No tracheal deviation present. No thyromegaly present.  Pulmonary/Chest: Effort normal. No accessory muscle usage. Not tachypneic. She has no decreased breath sounds. She has no rhonchi.  Musculoskeletal: Normal range of motion. She exhibits no edema and no tenderness.       Lumbar back: She exhibits pain. She exhibits normal range of motion, no tenderness, no bony tenderness, no edema and no spasm.       Back:  Lymphadenopathy:    She has no cervical adenopathy.  Neurological: She is alert and oriented to person, place, and time. No cranial nerve deficit. She exhibits normal muscle tone. Coordination normal.  Skin: Skin is warm and dry. No rash noted. She is not diaphoretic. No erythema. No pallor.  Psychiatric: She has a normal mood and affect. Her behavior is normal. Judgment and thought content normal.          Assessment & Plan:

## 2012-09-11 NOTE — Assessment & Plan Note (Signed)
Symptoms of right hip pain x 1 week. Consistent with SI joint inflammation. No improvement with ibuprofen. Xray shows no acute injury. Will set up sports med eval. Question if she might benefit from local SI joint steroid injection.

## 2012-09-17 ENCOUNTER — Encounter: Payer: Self-pay | Admitting: Family Medicine

## 2012-09-17 ENCOUNTER — Ambulatory Visit (INDEPENDENT_AMBULATORY_CARE_PROVIDER_SITE_OTHER): Payer: Medicare Other | Admitting: Family Medicine

## 2012-09-17 VITALS — BP 128/80 | HR 64 | Temp 98.5°F | Ht 67.0 in | Wt 168.8 lb

## 2012-09-17 DIAGNOSIS — M7918 Myalgia, other site: Secondary | ICD-10-CM

## 2012-09-17 DIAGNOSIS — IMO0001 Reserved for inherently not codable concepts without codable children: Secondary | ICD-10-CM

## 2012-09-17 DIAGNOSIS — IMO0002 Reserved for concepts with insufficient information to code with codable children: Secondary | ICD-10-CM

## 2012-09-17 DIAGNOSIS — S73191A Other sprain of right hip, initial encounter: Secondary | ICD-10-CM

## 2012-09-17 NOTE — Progress Notes (Signed)
Date:  09/17/2012   Name:  [name]   DOB:  Feb 18, 1945   MRN:  [id number] Gender: female Age: 67 y.o.  Primary Physician: Wynona Dove, MD  Dear Dr. Dan Humphreys,  Thank you for having me see [name] in consultation today at [facility] at [facility] for her problem with R hip pain.  As you may recall, she is a 67 y.o. year old female with a history of R posterior buttocks pain that worsened after recently increasing her impact and activity in the last month.   Now alternativing between heavy walking and jogging and has had some posterior buttocks pain. Still will have a catch sometimes and cannot jog on the treadmill. She has lost approximately 100 pounds from a baseline of 265. Working with Ventura Sellers at [facility].   264 pounds, that heavy a long time. S/p endometrial cancer. Hyst only.   She denies any anterior groin pain. Her hip films were reviewed independently by myself and shows some minimal osteoarthritic changes only.  Dg Hip Complete Right  09/11/2012   *RADIOLOGY REPORT*  Clinical Data: Right hip pain when running  RIGHT HIP - COMPLETE 2+ VIEW  Comparison: None.  Findings: Three views of the right hip submitted.  No acute fracture or subluxation.  Mild degenerative changes bilateral hip joints with mild narrowing  of superior joint space.  Minimal spurring bilateral greater femoral trochanter.  Mild degenerative changes lower lumbar spine at L4-L5 level .  Right pelvic phleboliths are noted.  IMPRESSION: No acute fracture or subluxation.  Mild degenerative changes.   Original Report Authenticated By: Natasha Mead, M.D.     Past Medical History  Diagnosis Date  . Endometrial cancer     s/p hysterectomy    Past Surgical History  Procedure Laterality Date  . Abdominal hysterectomy  2010    Endometrial Ca  . Cholecystectomy  2010    History   Social History  . Marital Status: Single    Spouse Name: N/A    Number of Children: 1  . Years of Education:  N/A   Occupational History  . Retired    Social History Main Topics  . Smoking status: Never Smoker   . Smokeless tobacco: Never Used  . Alcohol Use: Yes     Comment: Rarely  . Drug Use: No  . Sexually Active: None   Other Topics Concern  . None   Social History Narrative   Lives with mother in [place]. 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 Exercises regularly and follows a healthy diet.             No family history on file.  Medications and Allergies reviewed  Outpatient Prescriptions Prior to Visit  Medication Sig Dispense Refill  . brimonidine-timolol (COMBIGAN) 0.2-0.5 % ophthalmic solution Place 1 drop into both eyes every 12 (twelve) hours.      . Calcium-Vitamin D-Vitamin K (CALCIUM + D + K PO) Take by mouth daily.      . meclizine (ANTIVERT) 25 MG tablet Take 1 tablet (25 mg total) by mouth 3 (three) times daily as needed.  30 tablet  0  . fish oil-omega-3 fatty acids 1000 MG capsule Take 2 g by mouth daily.      . vitamin E 400 UNIT capsule Take 400 Units by mouth daily.       Facility-Administered Medications Prior to Visit  Medication Dose Route Frequency Provider Last Rate Last Dose  . cyanocobalamin ((VITAMIN B-12)) injection 1,000  mcg  1,000 mcg Intramuscular Once Wynona Dove, MD        Review of Systems:    GEN: No fevers, chills. Nontoxic. Primarily MSK c/o today. MSK: Detailed in the HPI GI: tolerating PO intake without difficulty Neuro: No numbness, parasthesias, or tingling associated. Otherwise the pertinent positives of the ROS are noted above.    Physical Examination: Filed Vitals:   09/17/12 1212  BP: 128/80  Pulse: 64  Temp: 98.5 F (36.9 C)  TempSrc: Oral  Height: 5\' 7"  (1.702 m)  Weight: 168 lb 12 oz (76.544 kg)      GEN: WDWN, NAD, Non-toxic, Alert & Oriented x 3 HEENT: Atraumatic, Normocephalic.  Ears and Nose: No external deformity. EXTR: No clubbing/cyanosis/edema NEURO: Normal gait.  PSYCH: Normally  interactive. Conversant. Not depressed or anxious appearing.  Calm demeanor.   HIP EXAM: SIDE: R ROM: Abduction, Flexion, Internal and External range of motion: full Pain with terminal IROM and EROM: no GTB: NT SLR: NEG Knees: No effusion FABER: NT REVERSE FABER: NT, neg Piriformis: Tender to palpation directly and just superior to this along pelvic rim.  SI joint NT Str: flexion: 5/5 abduction: 4-/5 on the R adduction: 5/5 Strength testing non-tender  Neurovascularly intact in the lower extremities throughout.     Assessment and Plan:  Impression: Right sided posterior pelvic weakness and pain with piriformis spasm and insertional gluteus medius and minimus tendinopathy.   Recommendations: For now, initiate hip rehabilitation, stretching, and strengthening of the hip abductors. I would anticipate that she will do well, and she is highly motivated.  I appreciate the opportunity to evaluate this very friendly patient. If you have any question regarding her care or prognosis, do not hesitate to ask.    We will see the patient back prn, but asked her to followup with me in 4-6 weeks if she is not improving.  Thank you for having [place] see [name] in consultation.  Feel free to contact me with any questions.  Signed, Elpidio Galea. Etna Forquer, MD, CAQ Sports Medicine [employer] at [facility] [address] [place], [place] [address] Phone: [phone] Fax: [phone]

## 2012-09-25 ENCOUNTER — Ambulatory Visit (INDEPENDENT_AMBULATORY_CARE_PROVIDER_SITE_OTHER): Payer: Medicare Other | Admitting: *Deleted

## 2012-09-25 DIAGNOSIS — E538 Deficiency of other specified B group vitamins: Secondary | ICD-10-CM

## 2012-09-25 MED ORDER — CYANOCOBALAMIN 1000 MCG/ML IJ SOLN
1000.0000 ug | Freq: Once | INTRAMUSCULAR | Status: AC
  Administered 2012-09-25: 1000 ug via INTRAMUSCULAR

## 2012-10-24 ENCOUNTER — Ambulatory Visit (INDEPENDENT_AMBULATORY_CARE_PROVIDER_SITE_OTHER): Payer: Medicare Other | Admitting: Family Medicine

## 2012-10-24 ENCOUNTER — Encounter: Payer: Self-pay | Admitting: Family Medicine

## 2012-10-24 VITALS — BP 120/80 | HR 60 | Temp 98.1°F | Ht 67.0 in | Wt 171.2 lb

## 2012-10-24 DIAGNOSIS — S73191A Other sprain of right hip, initial encounter: Secondary | ICD-10-CM

## 2012-10-24 DIAGNOSIS — M76899 Other specified enthesopathies of unspecified lower limb, excluding foot: Secondary | ICD-10-CM

## 2012-10-24 DIAGNOSIS — IMO0002 Reserved for concepts with insufficient information to code with codable children: Secondary | ICD-10-CM

## 2012-10-24 DIAGNOSIS — M7061 Trochanteric bursitis, right hip: Secondary | ICD-10-CM

## 2012-10-24 NOTE — Progress Notes (Signed)
Nature conservation officer at [facility] [address] [place] [address] Phone: [phone] Fax: [phone]  Date:  10/24/2012   Name:  [name]   DOB:  Jun 17, 1945   MRN:  [id number] Gender: female Age: 67 y.o.  Primary Physician:  Wynona Dove, MD  Evaluating MD: Hannah Beat, MD  Chief Complaint: Follow-up   History of Present Illness:  [name] is a 67 y.o. very pleasant female patient who presents with the following:   Intermittent - some days better compared to others, some ok, some not. F/u R posterior buttocks pain and now with some lateral pain. No groin pain and minimal OA on plain film. Getting better, but she will have some days better and other days will not be good. Today she is feeling pretty well.  GTB Pelvic rim    Past Medical History, Surgical History, Social History, Family History, Problem List, Medications, and Allergies have been reviewed and updated if relevant.  Current Outpatient Prescriptions on File Prior to Visit  Medication Sig Dispense Refill  . brimonidine-timolol (COMBIGAN) 0.2-0.5 % ophthalmic solution Place 1 drop into both eyes every 12 (twelve) hours.      . Calcium-Vitamin D-Vitamin K (CALCIUM + D + K PO) Take by mouth daily.      . fish oil-omega-3 fatty acids 1000 MG capsule Take 2 g by mouth daily.      . meclizine (ANTIVERT) 25 MG tablet Take 1 tablet (25 mg total) by mouth 3 (three) times daily as needed.  30 tablet  0  . vitamin E 400 UNIT capsule Take by mouth once a week       Current Facility-Administered Medications on File Prior to Visit  Medication Dose Route Frequency Provider Last Rate Last Dose  . cyanocobalamin ((VITAMIN B-12)) injection 1,000 mcg  1,000 mcg Intramuscular Once Wynona Dove, MD        Review of Systems:  GEN: No fevers, chills. Nontoxic. Primarily MSK c/o today. MSK: Detailed in the HPI GI: tolerating PO intake without difficulty Neuro: No numbness, parasthesias, or  tingling associated. Otherwise the pertinent positives of the ROS are noted above.    Physical Examination: BP 120/80  Pulse 60  Temp(Src) 98.1 F (36.7 C) (Oral)  Ht 5\' 7"  (1.702 m)  Wt 171 lb 4 oz (77.678 kg)  BMI 26.82 kg/m2   GEN: WDWN, NAD, Non-toxic, Alert & Oriented x 3 HEENT: Atraumatic, Normocephalic.  Ears and Nose: No external deformity. EXTR: No clubbing/cyanosis/edema NEURO: Normal gait.  PSYCH: Normally interactive. Conversant. Not depressed or anxious appearing.  Calm demeanor.   HIP EXAM: SIDE: R ROM: Abduction, Flexion, Internal and External range of motion: full Pain with terminal IROM and EROM: no GTB: TTP on the RIGHT SLR: NEG Knees: No effusion FABER: NT REVERSE FABER: NT, neg Piriformis: Tender to palpation directly and just superior to this along pelvic rim.   SI joint NT Str: flexion: 5/5 abduction: 4-/5 on the R adduction: 5/5 Strength testing non-tender  Neurovascularly intact in the lower extremities throughout.   Assessment and Plan:  Gluteus medius or minimus syndrome, right, initial encounter  Trochanteric bursitis, right  Counselled. I thought doing a GTB inj and at the superior glute medius insertion would be helpful, but she wants to keep up with her rehab. I think that is reasonable. Overall, she seems somewhat better.   Orders Today:  No orders of the defined types were placed in this encounter.    Updated Medication  List: (Includes new medications, updates to list, dose adjustments) No orders of the defined types were placed in this encounter.    Medications Discontinued: There are no discontinued medications.    Signed, Elpidio Galea. Rada Zegers, MD 10/24/2012 9:44 AM

## 2012-10-30 ENCOUNTER — Ambulatory Visit (INDEPENDENT_AMBULATORY_CARE_PROVIDER_SITE_OTHER): Payer: Medicare Other | Admitting: *Deleted

## 2012-10-30 DIAGNOSIS — E538 Deficiency of other specified B group vitamins: Secondary | ICD-10-CM

## 2012-10-30 MED ORDER — CYANOCOBALAMIN 1000 MCG/ML IJ SOLN
1000.0000 ug | Freq: Once | INTRAMUSCULAR | Status: AC
  Administered 2012-10-30: 1000 ug via INTRAMUSCULAR

## 2012-12-05 ENCOUNTER — Encounter: Payer: 59 | Admitting: Internal Medicine

## 2012-12-20 ENCOUNTER — Other Ambulatory Visit: Payer: Self-pay

## 2012-12-31 ENCOUNTER — Encounter: Payer: Self-pay | Admitting: Internal Medicine

## 2012-12-31 ENCOUNTER — Ambulatory Visit (INDEPENDENT_AMBULATORY_CARE_PROVIDER_SITE_OTHER): Payer: Medicare Other | Admitting: Internal Medicine

## 2012-12-31 VITALS — BP 110/80 | HR 59 | Temp 98.5°F | Ht 66.0 in | Wt 172.0 lb

## 2012-12-31 DIAGNOSIS — H8110 Benign paroxysmal vertigo, unspecified ear: Secondary | ICD-10-CM

## 2012-12-31 DIAGNOSIS — Z780 Asymptomatic menopausal state: Secondary | ICD-10-CM

## 2012-12-31 DIAGNOSIS — Z0000 Encounter for general adult medical examination without abnormal findings: Secondary | ICD-10-CM

## 2012-12-31 DIAGNOSIS — Z136 Encounter for screening for cardiovascular disorders: Secondary | ICD-10-CM

## 2012-12-31 LAB — COMPREHENSIVE METABOLIC PANEL
Alkaline Phosphatase: 79 U/L (ref 39–117)
BUN: 19 mg/dL (ref 6–23)
Glucose, Bld: 84 mg/dL (ref 70–99)
Total Bilirubin: 0.9 mg/dL (ref 0.3–1.2)

## 2012-12-31 LAB — LDL CHOLESTEROL, DIRECT: Direct LDL: 143.6 mg/dL

## 2012-12-31 LAB — CBC WITH DIFFERENTIAL/PLATELET
Basophils Relative: 0.5 % (ref 0.0–3.0)
Eosinophils Relative: 1.3 % (ref 0.0–5.0)
HCT: 42.2 % (ref 36.0–46.0)
Lymphs Abs: 1.5 10*3/uL (ref 0.7–4.0)
MCV: 90.5 fl (ref 78.0–100.0)
Monocytes Absolute: 0.6 10*3/uL (ref 0.1–1.0)
Neutrophils Relative %: 69.4 % (ref 43.0–77.0)
RBC: 4.66 Mil/uL (ref 3.87–5.11)
WBC: 7.6 10*3/uL (ref 4.5–10.5)

## 2012-12-31 LAB — LIPID PANEL
Cholesterol: 220 mg/dL — ABNORMAL HIGH (ref 0–200)
HDL: 56.1 mg/dL (ref >39.00)
Triglycerides: 107 mg/dL (ref 0.0–149.0)

## 2012-12-31 LAB — POCT URINALYSIS DIPSTICK
Bilirubin, UA: NEGATIVE
Ketones, UA: NEGATIVE
Protein, UA: NEGATIVE
Spec Grav, UA: 1.015

## 2012-12-31 NOTE — Progress Notes (Signed)
Subjective:    Patient ID: [name], female    DOB: September 17, 1945, 67 y.o.   MRN: [id number]  HPI The patient is here for annual Medicare wellness examination and management of other chronic and acute problems.   The risk factors are reflected in the social history.  The roster of all physicians providing medical care to patient - is listed in the Snapshot section of the chart.  Activities of daily living:  The patient is 100% independent in all ADLs: dressing, toileting, feeding as well as independent mobility  Home safety : The patient has smoke detectors in the home. They wear seatbelts.  There are no firearms at home. There is no violence in the home.   There is no risks for hepatitis, STDs or HIV. There is no history of blood transfusion. They have no travel history to infectious disease endemic areas of the world.  The patient has seen their dentist in the last six month. (Dr. Malen Gauze) They have seen their eye doctor in the last year. (Dr. Earleen Reaper) Hearing testing in the past, wears hearing aids bilaterally.  They have deferred audiologic testing in the last year.   Dermatologist - Dr. Ebony Cargo. They do not  have excessive sun exposure. Discussed the need for sun protection: hats, long sleeves and use of sunscreen if there is significant sun exposure.   Diet: the importance of a healthy diet is discussed. They do have a very healthy diet.  The benefits of regular aerobic exercise were discussed. She works out with a Systems analyst and by herself several times per week.  Depression screen: there are no signs or vegative symptoms of depression- irritability, change in appetite, anhedonia, sadness/tearfullness.  Cognitive assessment: the patient manages all their financial and personal affairs and is actively engaged. They could relate day,date,year and events.  HCPOA - brother, Caitlin Ainley.  Living will in place.  The following portions of the patient's history  were reviewed and updated as appropriate: allergies, current medications, past family history, past medical history,  past surgical history, past social history  and problem list.  Visual acuity was not assessed per patient preference since she has regular follow up with her ophthalmologist. Hearing and body mass index were assessed and reviewed.   During the course of the visit the patient was educated and counseled about appropriate screening and preventive services including : fall prevention , diabetes screening, nutrition counseling, colorectal cancer screening, and recommended immunizations.      Review of Systems  Constitutional: Negative for fever, chills, appetite change, fatigue and unexpected weight change.  HENT: Negative for congestion, ear pain, sinus pressure, sore throat, trouble swallowing and voice change.   Eyes: Negative for visual disturbance.  Respiratory: Negative for cough, shortness of breath, wheezing and stridor.   Cardiovascular: Negative for chest pain, palpitations and leg swelling.  Gastrointestinal: Negative for nausea, vomiting, abdominal pain, diarrhea, constipation, blood in stool, abdominal distention and anal bleeding.  Genitourinary: Negative for dysuria and flank pain.  Musculoskeletal: Negative for arthralgias, gait problem, myalgias and neck pain.  Skin: Negative for color change and rash.  Neurological: Negative for dizziness and headaches.  Hematological: Negative for adenopathy. Does not bruise/bleed easily.  Psychiatric/Behavioral: Negative for suicidal ideas, sleep disturbance and dysphoric mood. The patient is not nervous/anxious.    BP 110/80  Pulse 59  Temp(Src) 98.5 F (36.9 C) (Oral)  Ht 5\' 6"  (1.676 m)  Wt 172 lb (78.019 kg)  BMI 27.77 kg/m2  SpO2 97%  Objective:   Physical Exam  Constitutional: She is oriented to person, place, and time. She appears well-developed and well-nourished. No distress.  HENT:  Head: Normocephalic  and atraumatic.  Right Ear: External ear normal.  Left Ear: External ear normal.  Nose: Nose normal.  Mouth/Throat: Oropharynx is clear and moist. No oropharyngeal exudate.  Eyes: Conjunctivae are normal. Pupils are equal, round, and reactive to light. Right eye exhibits no discharge. Left eye exhibits no discharge. No scleral icterus.  Neck: Normal range of motion. Neck supple. No tracheal deviation present. No thyromegaly present.  Cardiovascular: Normal rate, regular rhythm, normal heart sounds and intact distal pulses.  Exam reveals no gallop and no friction rub.   No murmur heard. Pulmonary/Chest: Effort normal and breath sounds normal. No accessory muscle usage. Not tachypneic. No respiratory distress. She has no decreased breath sounds. She has no wheezes. She has no rales. She exhibits no tenderness. Right breast exhibits no inverted nipple, no mass, no nipple discharge, no skin change and no tenderness. Left breast exhibits no inverted nipple, no mass, no nipple discharge, no skin change and no tenderness. Breasts are symmetrical.  Abdominal: Soft. Bowel sounds are normal. She exhibits no distension and no mass. There is no tenderness. There is no rebound and no guarding.  Musculoskeletal: Normal range of motion. She exhibits no edema and no tenderness.  Lymphadenopathy:    She has no cervical adenopathy.  Neurological: She is alert and oriented to person, place, and time. No cranial nerve deficit. She exhibits normal muscle tone. Coordination normal.  Skin: Skin is warm and dry. No rash noted. She is not diaphoretic. No erythema. No pallor.  Psychiatric: She has a normal mood and affect. Her behavior is normal. Judgment and thought content normal.          Assessment & Plan:

## 2012-12-31 NOTE — Progress Notes (Signed)
Pre-visit discussion using our clinic review tool. No additional management support is needed unless otherwise documented below in the visit note.  

## 2012-12-31 NOTE — Addendum Note (Signed)
Addended by: Ronna Polio A on: 12/31/2012 10:10 AM   Modules accepted: Orders

## 2012-12-31 NOTE — Addendum Note (Signed)
Addended by: Montine Circle D on: 12/31/2012 03:47 PM   Modules accepted: Orders

## 2012-12-31 NOTE — Assessment & Plan Note (Signed)
General medical exam including breast exam normal today. PAP and pelvic deferred as normal 2013, and pt s/p complete hysterectomy. Mammogram UTD. Colonoscopy UTD. Will order Dexa scan. Labs today including CBC, CMP, lipids. Encouraged continued healthy diet and regular physical activity. Follow up 1 year and prn.

## 2013-01-01 ENCOUNTER — Encounter: Payer: Self-pay | Admitting: Internal Medicine

## 2013-01-01 LAB — URINE CULTURE: Colony Count: NO GROWTH

## 2013-02-03 ENCOUNTER — Encounter: Payer: Self-pay | Admitting: Internal Medicine

## 2013-02-04 ENCOUNTER — Encounter: Payer: Self-pay | Admitting: Internal Medicine

## 2013-02-04 ENCOUNTER — Ambulatory Visit (INDEPENDENT_AMBULATORY_CARE_PROVIDER_SITE_OTHER): Payer: Medicare Other | Admitting: Internal Medicine

## 2013-02-04 VITALS — BP 130/98 | HR 61 | Temp 98.0°F | Wt 183.0 lb

## 2013-02-04 DIAGNOSIS — H8110 Benign paroxysmal vertigo, unspecified ear: Secondary | ICD-10-CM

## 2013-02-04 DIAGNOSIS — K130 Diseases of lips: Secondary | ICD-10-CM

## 2013-02-04 MED ORDER — NYSTATIN-TRIAMCINOLONE 100000-0.1 UNIT/GM-% EX OINT: 1.0000 "application " | TOPICAL_OINTMENT | Freq: Two times a day (BID) | CUTANEOUS | Status: DC

## 2013-02-04 MED ORDER — MECLIZINE HCL 25 MG PO TABS: 25.0000 mg | ORAL_TABLET | Freq: Three times a day (TID) | ORAL | Status: DC | PRN

## 2013-02-04 NOTE — Progress Notes (Signed)
Pre-visit discussion using our clinic review tool. No additional management support is needed unless otherwise documented below in the visit note.  

## 2013-02-04 NOTE — Patient Instructions (Signed)
Use gentle cleanser like cetaphil.  Apply Mycolog cream twice daily.  Apply Vasaline several times per day as a protectant.  Email if no improvement by Wednesday.

## 2013-02-04 NOTE — Progress Notes (Signed)
   Subjective:    Patient ID: [name], female    DOB: [birth year], 67 y.o.   MRN: [id number]  HPI 67 year old female presents for acute visit complaining of redness and cracking around her lips for several days. She has been applying topical lip balm's with no improvement. The cracking at her lip creases very painful. She denies any fever or chills.  Outpatient Prescriptions Prior to Visit  Medication Sig Dispense Refill  . brimonidine-timolol (COMBIGAN) 0.2-0.5 % ophthalmic solution Place 1 drop into both eyes every 12 (twelve) hours.      . Calcium-Vitamin D-Vitamin K (CALCIUM + D + K PO) Take by mouth daily.      . fish oil-omega-3 fatty acids 1000 MG capsule Take 2 g by mouth daily.      . vitamin E 400 UNIT capsule Take by mouth once a week      . meclizine (ANTIVERT) 25 MG tablet Take 1 tablet (25 mg total) by mouth 3 (three) times daily as needed.  30 tablet  0    BP 130/98  Pulse 61  Temp(Src) 98 F (36.7 C) (Oral)  Wt 183 lb (83.008 kg)  SpO2 99%  Review of Systems  Constitutional: Negative for fever and chills.  HENT: Negative for sore throat and trouble swallowing.   Respiratory: Negative for shortness of breath.   Skin: Positive for color change, rash and wound.       Objective:   Physical Exam  Constitutional: She is oriented to person, place, and time. She appears well-developed and well-nourished. No distress.  HENT:  Head: Normocephalic and atraumatic.  Right Ear: External ear normal.  Left Ear: External ear normal.  Nose: Nose normal.  Mouth/Throat: Oropharynx is clear and moist.    Eyes: Conjunctivae are normal. Pupils are equal, round, and reactive to light. Right eye exhibits no discharge. Left eye exhibits no discharge. No scleral icterus.  Neck: Normal range of motion. Neck supple. No tracheal deviation present. No thyromegaly present.  Pulmonary/Chest: Effort normal. No accessory muscle usage. Not tachypneic. She has no decreased breath sounds.  She has no rhonchi.  Musculoskeletal: Normal range of motion. She exhibits no edema and no tenderness.  Lymphadenopathy:    She has no cervical adenopathy.  Neurological: She is alert and oriented to person, place, and time. No cranial nerve deficit. She exhibits normal muscle tone. Coordination normal.  Skin: Skin is warm and dry. No rash noted. She is not diaphoretic. No erythema. No pallor.  Psychiatric: She has a normal mood and affect. Her behavior is normal. Judgment and thought content normal.          Assessment & Plan:

## 2013-02-04 NOTE — Assessment & Plan Note (Signed)
Symptoms and exam are consistent with angular acute life this. We discussed this may be secondary to allergic reaction to chemicals in lip balms, fungal infections, or bacterial infections. Encouraged her to use plain Vaseline on her lips. Will start topical triamcinolone/nystatin ointment. If no improvement over the next few days, she will call or return to clinic. Culture sent today to evaluate.

## 2013-02-07 LAB — WOUND CULTURE
Gram Stain: NONE SEEN
Gram Stain: NONE SEEN

## 2013-02-08 ENCOUNTER — Encounter: Payer: Self-pay | Admitting: Internal Medicine

## 2013-03-03 ENCOUNTER — Encounter: Payer: Self-pay | Admitting: Internal Medicine

## 2013-03-06 ENCOUNTER — Ambulatory Visit (INDEPENDENT_AMBULATORY_CARE_PROVIDER_SITE_OTHER): Payer: Medicare Other | Admitting: *Deleted

## 2013-03-06 DIAGNOSIS — D519 Vitamin B12 deficiency anemia, unspecified: Secondary | ICD-10-CM

## 2013-03-06 DIAGNOSIS — D518 Other vitamin B12 deficiency anemias: Secondary | ICD-10-CM

## 2013-03-06 MED ORDER — CYANOCOBALAMIN 1000 MCG/ML IJ SOLN
1000.0000 ug | Freq: Once | INTRAMUSCULAR | Status: AC
  Administered 2013-03-06: 1000 ug via INTRAMUSCULAR

## 2013-04-04 ENCOUNTER — Encounter: Payer: Self-pay | Admitting: Internal Medicine

## 2013-04-09 ENCOUNTER — Ambulatory Visit: Payer: Medicare Other

## 2013-04-10 ENCOUNTER — Ambulatory Visit (INDEPENDENT_AMBULATORY_CARE_PROVIDER_SITE_OTHER): Payer: Medicare Other | Admitting: *Deleted

## 2013-04-10 DIAGNOSIS — E538 Deficiency of other specified B group vitamins: Secondary | ICD-10-CM

## 2013-04-10 MED ORDER — CYANOCOBALAMIN 1000 MCG/ML IJ SOLN
1000.0000 ug | Freq: Once | INTRAMUSCULAR | Status: AC
  Administered 2013-04-10: 1000 ug via INTRAMUSCULAR

## 2013-04-17 ENCOUNTER — Encounter: Payer: Self-pay | Admitting: Internal Medicine

## 2013-04-21 ENCOUNTER — Encounter: Payer: Self-pay | Admitting: Internal Medicine

## 2013-04-22 ENCOUNTER — Encounter: Payer: Self-pay | Admitting: Emergency Medicine

## 2013-04-22 ENCOUNTER — Encounter: Payer: Self-pay | Admitting: Internal Medicine

## 2013-04-22 DIAGNOSIS — E663 Overweight: Secondary | ICD-10-CM

## 2013-05-02 ENCOUNTER — Ambulatory Visit: Payer: Self-pay | Admitting: Internal Medicine

## 2013-05-07 ENCOUNTER — Encounter: Payer: Self-pay | Admitting: Family Medicine

## 2013-05-07 ENCOUNTER — Ambulatory Visit (INDEPENDENT_AMBULATORY_CARE_PROVIDER_SITE_OTHER): Payer: Medicare Other | Admitting: Family Medicine

## 2013-05-07 VITALS — BP 124/82 | HR 59 | Ht 67.0 in | Wt 188.0 lb

## 2013-05-07 DIAGNOSIS — R635 Abnormal weight gain: Secondary | ICD-10-CM

## 2013-05-07 NOTE — Progress Notes (Signed)
  [facility] [address] [place], [place] [address] Phone: [phone] Subjective:    I'm seeing this patient by the request  of:  Rica Mast, MD   CC: overweight, nutrition counseling.   [id number] REVER PICHETTE is a 68 y.o. female coming in with complaint of weight gain. Patient recently has lost a significant amount weight over 100 pounds and states though that she started exercising more often actually has gained 25 pounds. Patient states she is not change her diet if anything she is eating less. Patient has been reading a significant amount of information and listening to many of her trainers but has not had any benefit. Patient is here for further evaluation. Patient denies anything such as hair loss, or any r edema. Patient denies any fevers or chills or any recent illnesses. Patient does not limit her 2 choices significantly but does not the right knee. Patient does have many other types of protein a. We discussed at great length patient's nutritional planes. Patient states that she wakes up in does have some water and then has significant amount of protein before her exercising and does not eat for approximately 4 hours after exercise. When she does he is usually only a salad with some mild dressing. Patient and will do some other snacks throughout the day that are usually low-fat options such as yogurt. Patient has also changed her Velcro splint to almond milk. Patient usually eats dinner approximately 6 to 7 PM and tries to avoid anything after that time.     Past medical history, social, surgical and family history all reviewed in electronic medical record.   Review of Systems: No headache, visual changes, nausea, vomiting, diarrhea, constipation, dizziness, abdominal pain, skin rash, fevers, chills, night sweats, weight loss, swollen lymph nodes, body aches, joint swelling, muscle aches, chest pain, shortness of breath, mood changes.    Objective Blood pressure 124/82, pulse 59, height 5\' 7"  (1.702 m), weight 188 lb (85.276 kg), SpO2 97.00%.  General: No apparent distress alert and oriented x3 mood and affect normal, dressed appropriately.  HEENT: Pupils equal, extraocular movements intact  Respiratory: Patient's speak in full sentences and does not appear short of breath  Cardiovascular: No lower extremity edema, non tender, no erythema  Skin: Warm dry intact with no signs of infection or rash on extremities or on axial skeleton.  Abdomen: Soft nontender  Neuro: Cranial nerves II through XII are intact, neurovascularly intact in all extremities with 2+ DTRs and 2+ pulses.  Lymph: No lymphadenopathy of posterior or anterior cervical chain or axillae bilaterally.  Gait normal with good balance and coordination.  MSK:  Non tender with full range of motion and good stability and symmetric strength and tone of shoulders, elbows, wrist, hip, knee and ankles bilaterally.     Impression and Recommendations:     This case required medical decision making of moderate complexity.

## 2013-05-07 NOTE — Patient Instructions (Signed)
Good to meet you! 7 am wake up 2 glasses of water immediatly - increases your metabolism  Focus on simple sugars for breakfast if working out.  Honey, fruit, milk (cow milk or almond at this time).  This will give your muscle the energy.  Good sugars would be a combination of sucrose, fructose and galactose.  If working out more than 45 minutes at one time add 1/4 cup of gatorade to your water.  This will help give you energy to go harder during exercise.  After exercise I want you to take in a 4:1 ratio of carbs to protein.  Low fat Chocolate milk is a good example.   Consider chocolate milk with a scoop whey protein isolate and 1/2- full banana. NEED TO DO IT WITHIN 30 MINUTES OF ACTIVTY.  The through the day no longer than 3 hours until food.  Almonds nuts, yogurt are all great snacks.  Green vegetables would be wonderful as well.  Every meal you need a starch, vegetable and protein.  After 6 limit the carbohydrates-  Dinners should be heavy on protein and vegetables.  1 cup of water before bed.  Vitamin D 2000 IU daily.  Multi- vitamin  Repeat and enjoy! Come back again in 3 weeks and tell me how you are doing.

## 2013-05-07 NOTE — Assessment & Plan Note (Signed)
Patient has done incredibly well with losing weight on her own over the course of multiple years. Patient is concerned as she has gained some weight over the the past several months. Patient has increased her activity and is doing approximately 1-2 hours of exercise most days a week. I do think patient is having some trouble with her nutrition we discussed different changes. We discussed timing of food as well as the different ratios of food that can be beneficial. Patient will do this in time and more around her exercise schedule which will be helpful. This will help patient be optimal health while she is working out as well as decrease patient going into more of starvation state.

## 2013-05-08 ENCOUNTER — Encounter: Payer: Self-pay | Admitting: Internal Medicine

## 2013-05-13 ENCOUNTER — Encounter: Payer: Self-pay | Admitting: Family Medicine

## 2013-05-13 ENCOUNTER — Ambulatory Visit (INDEPENDENT_AMBULATORY_CARE_PROVIDER_SITE_OTHER): Payer: Medicare Other | Admitting: *Deleted

## 2013-05-13 DIAGNOSIS — E538 Deficiency of other specified B group vitamins: Secondary | ICD-10-CM

## 2013-05-13 MED ORDER — CYANOCOBALAMIN 1000 MCG/ML IJ SOLN
1000.0000 ug | Freq: Once | INTRAMUSCULAR | Status: AC
  Administered 2013-05-13: 1000 ug via INTRAMUSCULAR

## 2013-05-28 ENCOUNTER — Encounter: Payer: Self-pay | Admitting: Family Medicine

## 2013-05-28 ENCOUNTER — Ambulatory Visit (INDEPENDENT_AMBULATORY_CARE_PROVIDER_SITE_OTHER): Payer: Medicare Other | Admitting: Family Medicine

## 2013-05-28 VITALS — BP 130/84 | HR 52 | Wt 185.0 lb

## 2013-05-28 DIAGNOSIS — R635 Abnormal weight gain: Secondary | ICD-10-CM

## 2013-05-28 NOTE — Patient Instructions (Signed)
You are doing amazing! Continue what you are doing Flax seed, olive oil walnuts would all be great fats to add Fish oil 2 grams daily will give you your goods  Try not to have carbs alone for snacks, instead with some type of protein or fat.  If possible more dairy at night if need a sweet tooth fix (greek yogurt, cottage cheese) Sleep is key- 7 hours is goal.  Other than that you are doing great.  Lets have you come back in 6 weeks. Goal at that another 3#.  Still only weight 1- 2 times a week.

## 2013-05-28 NOTE — Progress Notes (Signed)
  [facility] [address] [place], [place] [address] Phone: [phone] Subjective:     CC: overweight, nutrition counseling.   [id number] [name] is a 68 y.o. female coming in for followup of nutritional counseling. Patient wants to continue losing weight but wants to be much more active and doing more exercises. Patient did make significant changes to her diet we discussed timing as well as protein supplementation that could be very helpful. Patient states she is felt like she has some more energy and states that has not been as difficult as she was anticipating. Patient has lost 3 pounds since previous exam and states that she can feel a difference. Vision is sleeping comfortably as well. Denies any symptoms or any side effects any medications. Has found it difficult to limit carbohydrates at night but otherwise in all directions fairly well.    Past medical history, social, surgical and family history all reviewed in electronic medical record.   Review of Systems: No headache, visual changes, nausea, vomiting, diarrhea, constipation, dizziness, abdominal pain, skin rash, fevers, chills, night sweats, weight loss, swollen lymph nodes, body aches, joint swelling, muscle aches, chest pain, shortness of breath, mood changes.   Objective Blood pressure 130/84, pulse 52, weight 185 lb (83.915 kg), SpO2 97.00%.  General: No apparent distress alert and oriented x3 mood and affect normal, dressed appropriately.  HEENT: Pupils equal, extraocular movements intact  Respiratory: Patient's speak in full sentences and does not appear short of breath  Cardiovascular: No lower extremity edema, non tender, no erythema  Skin: Warm dry intact with no signs of infection or rash on extremities or on axial skeleton.  Abdomen: Soft nontender  Neuro: Cranial nerves II through XII are intact, neurovascularly intact in all extremities with 2+ DTRs and 2+ pulses.  Lymph: No  lymphadenopathy of posterior or anterior cervical chain or axillae bilaterally.  Gait normal with good balance and coordination.  MSK:  Non tender with full range of motion and good stability and symmetric strength and tone of shoulders, elbows, wrist, hip, knee and ankles bilaterally.     Impression and Recommendations:     This case required medical decision making of moderate complexity.

## 2013-05-28 NOTE — Assessment & Plan Note (Addendum)
Spent greater than 25 minutes with patient face-to-face and had greater than 50% of counseling including as described above in assessment and plan.  Patient is doing remarkably well. Encourage her to continue the same regimen. I likely see patient was in another 10-15 pounds a regular basis but no other significant changes. Reviewing patient's week diary of food patient is somewhat low with only approximately 15% of her diet being essential fatty acids. Patient told to supplement with fish oil could be helpful. We also discussed eating carbohydrates with either protein and fat can also help decrease the distortion of carbohydrates which will also help patient with weight loss. We'll have patient continue with the supplementation of simple carbohydrates though before exercise. Patient will continue with these interventions and followup again in 4-6 weeks with a goal of losing another 3 pounds by that time.

## 2013-05-29 ENCOUNTER — Encounter: Payer: Self-pay | Admitting: Family Medicine

## 2013-06-13 ENCOUNTER — Encounter: Payer: Self-pay | Admitting: Internal Medicine

## 2013-06-14 ENCOUNTER — Encounter: Payer: Self-pay | Admitting: Adult Health

## 2013-06-14 ENCOUNTER — Ambulatory Visit: Payer: Medicare Other

## 2013-06-14 ENCOUNTER — Ambulatory Visit (INDEPENDENT_AMBULATORY_CARE_PROVIDER_SITE_OTHER): Payer: Medicare Other | Admitting: Adult Health

## 2013-06-14 VITALS — BP 142/87 | HR 73 | Temp 97.1°F | Resp 14 | Wt 190.0 lb

## 2013-06-14 DIAGNOSIS — E538 Deficiency of other specified B group vitamins: Secondary | ICD-10-CM

## 2013-06-14 DIAGNOSIS — L237 Allergic contact dermatitis due to plants, except food: Secondary | ICD-10-CM

## 2013-06-14 DIAGNOSIS — L255 Unspecified contact dermatitis due to plants, except food: Secondary | ICD-10-CM

## 2013-06-14 MED ORDER — PREDNISONE 10 MG PO TABS: ORAL_TABLET | ORAL | Status: DC

## 2013-06-14 MED ORDER — CYANOCOBALAMIN 1000 MCG/ML IJ SOLN
1000.0000 ug | Freq: Once | INTRAMUSCULAR | Status: AC
  Administered 2013-06-14: 1000 ug via INTRAMUSCULAR

## 2013-06-14 NOTE — Patient Instructions (Signed)
Start prednisone taper as follows:  Day #1 - take 6 tablets Day #2 - take 5 tablets Day #3 - take 4 tablets Day #4 - take 3 tablets Day #5 - take 2 tablets Day #6 - take 1 tablet   You can also take an over the counter antihistamine such as claritin, allegra or zyrtec.  Calamine lotion to the areas that are most itchy.  Poison [employer] ivy is a inflammation of the skin (contact dermatitis) caused by touching the allergens on the leaves of the ivy plant following previous exposure to the plant. The rash usually appears 48 hours after exposure. The rash is usually bumps (papules) or blisters (vesicles) in a linear pattern. Depending on your own sensitivity, the rash may simply cause redness and itching, or it may also progress to blisters which may break open. These must be well cared for to prevent secondary bacterial (germ) infection, followed by scarring. Keep any open areas dry, clean, dressed, and covered with an antibacterial ointment if needed. The eyes may also get puffy. The puffiness is worst in the morning and gets better as the day progresses. This dermatitis usually heals without scarring, within 2 to 3 weeks without treatment. HOME CARE INSTRUCTIONS  Thoroughly wash with soap and water as soon as you have been exposed to poison ivy. You have about one half hour to remove the plant resin before it will cause the rash. This washing will destroy the oil or antigen on the skin that is causing, or will cause, the rash. Be sure to wash under your fingernails as any plant resin there will continue to spread the rash. Do not rub skin vigorously when washing affected area. Poison ivy cannot spread if no oil from the plant remains on your body. A rash that has progressed to weeping sores will not spread the rash unless you have not washed thoroughly. It is also important to wash any clothes you have been wearing as these may carry active allergens. The rash will return if you wear the unwashed  clothing, even several days later. Avoidance of the plant in the future is the best measure. Poison ivy plant can be recognized by the number of leaves. Generally, poison ivy has three leaves with flowering branches on a single stem. Diphenhydramine may be purchased over the counter and used as needed for itching. Do not drive with this medication if it makes you drowsy.Ask your caregiver about medication for children. SEEK MEDICAL CARE IF:  Open sores develop.  Redness spreads beyond area of rash.  You notice purulent (pus-like) discharge.  You have increased pain.  Other signs of infection develop (such as fever). Document Released: 01/29/2000 Document Revised: 04/25/2011 Document Reviewed: 12/17/2008 [facility] Patient Information 2014 [place], [place].

## 2013-06-14 NOTE — Telephone Encounter (Signed)
Left message for pt to return my call.

## 2013-06-14 NOTE — Telephone Encounter (Signed)
Scheduled appt with Raquel 4:15 today

## 2013-06-14 NOTE — Progress Notes (Signed)
   Subjective:    Patient ID: [name], female    DOB: 03/25/1945, 68 y.o.   MRN: [id number]  HPI  Patient is a pleasant 68 year old female who presents to clinic with exposure to poison ivy while working out in her garden. She has several areas on her left arm that are swollen and red. She has a few areas that are breaking out on her right arm. Patient reports that she usually has an episode of poison ivy exposure at least once a year. She uses protective clothing and gloves while working out in her garden but she thinks that she may have touched her skin with the gloves which were possibly contaminated. She does not have any other symptoms.  Past Medical History  Diagnosis Date  . Endometrial cancer     s/p hysterectomy    Review of Systems  Constitutional: Negative.   HENT: Negative.   Eyes: Negative.   Respiratory: Negative.   Cardiovascular: Negative.   Gastrointestinal: Negative.   Endocrine: Negative.   Genitourinary: Negative.   Musculoskeletal: Negative.   Skin: Positive for rash.       Poison ivy exposure  Allergic/Immunologic: Negative.   Neurological: Negative.   Hematological: Negative.   Psychiatric/Behavioral: Negative.        Objective:   Physical Exam  Constitutional: She is oriented to person, place, and time. No distress.  HENT:  Head: Normocephalic and atraumatic.  Eyes: Conjunctivae and EOM are normal.  Neck: Normal range of motion. Neck supple.  Cardiovascular: Normal rate and regular rhythm.   Pulmonary/Chest: Effort normal. No respiratory distress.  Neurological: She is alert and oriented to person, place, and time. She has normal reflexes. Coordination normal.  Skin: Rash noted.  Poison ivy rash on bilateral arms. There are two larger areas on the left arm that are erythematous and swollen.  Psychiatric: She has a normal mood and affect. Her behavior is normal. Judgment and thought content normal.      Assessment & Plan:   1. Poison ivy  dermatitis Start prednisone taper as directed. OTC antihistamine. Calamine lotion. RTC if symptoms worsen or fail to improve within 3-4 days  2. B12 deficiency B12 injection administered - cyanocobalamin ((VITAMIN B-12)) injection 1,000 mcg; Inject 1 mL (1,000 mcg total) into the muscle once.

## 2013-06-14 NOTE — Progress Notes (Signed)
Pre visit review using our clinic review tool, if applicable. No additional management support is needed unless otherwise documented below in the visit note. 

## 2013-06-20 ENCOUNTER — Encounter: Payer: Self-pay | Admitting: Internal Medicine

## 2013-07-10 ENCOUNTER — Ambulatory Visit (INDEPENDENT_AMBULATORY_CARE_PROVIDER_SITE_OTHER): Payer: Medicare Other | Admitting: Family Medicine

## 2013-07-10 ENCOUNTER — Encounter: Payer: Self-pay | Admitting: Family Medicine

## 2013-07-10 VITALS — BP 128/84 | HR 67 | Ht 67.0 in | Wt 189.0 lb

## 2013-07-10 DIAGNOSIS — R635 Abnormal weight gain: Secondary | ICD-10-CM

## 2013-07-10 MED ORDER — TRIAMCINOLONE ACETONIDE 0.5 % EX CREA: 1.0000 "application " | TOPICAL_CREAM | Freq: Two times a day (BID) | CUTANEOUS | Status: DC

## 2013-07-10 NOTE — Progress Notes (Signed)
  [facility] [address], [place] [address] Phone: [phone] Subjective:     CC: overweight, nutrition counseling.   PPI:[id number] [name] is a 68 y.o. female coming in for followup of nutritional counseling. Patient was seen previously and was doing fairly well. We did do a three-day diarrhea we did make suggestions. Patient has been working out for 1-3 hours almost daily now. Patient is doing the supplementation after exercising and has made to different changes throughout the day of stacking. Patient is noticed that she is still having difficulty finding enough protein in her diet. Patient did recently have to have a prednisone burst secondary to poison ivy exposure. Patient feels like she is somewhat bloated from this. Otherwise patient has been feeling good. Patient states a lot of energy.    Past medical history, social, surgical and family history all reviewed in electronic medical record.   Review of Systems: No headache, visual changes, nausea, vomiting, diarrhea, constipation, dizziness, abdominal pain, skin rash, fevers, chills, night sweats, weight loss, swollen lymph nodes, body aches, joint swelling, muscle aches, chest pain, shortness of breath, mood changes.   Objective Blood pressure 128/84, pulse 67, height 5\' 7"  (1.702 m), weight 189 lb (85.73 kg), SpO2 98.00%.  General: No apparent distress alert and oriented x3 mood and affect normal, dressed appropriately.  HEENT: Pupils equal, extraocular movements intact  Respiratory: Patient's speak in full sentences and does not appear short of breath  Cardiovascular: No lower extremity edema, non tender, no erythema  Skin: Warm dry intact with no signs of infection or rash on extremities or on axial skeleton.  Abdomen: Soft nontender  Neuro: Cranial nerves II through XII are intact, neurovascularly intact in all extremities with 2+ DTRs and 2+ pulses.  Lymph: No lymphadenopathy of  posterior or anterior cervical chain or axillae bilaterally.  Gait normal with good balance and coordination.  MSK:  Non tender with full range of motion and good stability and symmetric strength and tone of shoulders, elbows, wrist, hip, knee and ankles bilaterally.   Filed Weights   07/10/13 1404  Weight: 189 lb (85.73 kg)   Weight 172-->183-->188-->185-->190-->189    Impression and Recommendations:     This case required medical decision making of moderate complexity.

## 2013-07-10 NOTE — Patient Instructions (Addendum)
Good to see you Prednisone set you back Do a mix of chocolate milk and muscle milk light that I think will be helpful.  If doing 2 classes  Though you have to eat after both of them.  I love the idea of more protein keep it lean and avoid lunch meat.  Try cream on your arms Caffeine before workout.  Come back again in 4 weeks.

## 2013-07-10 NOTE — Assessment & Plan Note (Signed)
Patient continues to have some difficulty with weight loss. I do think patient's prednisone was one of the complications. Patient is also working out still a significant amount of time and I think patient is somewhat calorie neck negative that is causing patient to have trouble losing weight. We discussed proper timeliness food again as well as when she is doing multiple classes in the gym she has to do multiple supplementation. We discussed casting before exercise and how this can help with free fatty acid mobilization. Patient is going to make these changes as well as others in patient instructions and then come back again in 4 weeks for further evaluation.  Spent greater than 25 minutes with patient face-to-face and had greater than 50% of counseling including as described above in assessment and plan.

## 2013-07-15 ENCOUNTER — Ambulatory Visit (INDEPENDENT_AMBULATORY_CARE_PROVIDER_SITE_OTHER): Payer: Medicare Other

## 2013-07-15 DIAGNOSIS — E538 Deficiency of other specified B group vitamins: Secondary | ICD-10-CM

## 2013-07-15 MED ORDER — CYANOCOBALAMIN 1000 MCG/ML IJ SOLN
1000.0000 ug | Freq: Once | INTRAMUSCULAR | Status: AC
  Administered 2013-07-15: 1000 ug via INTRAMUSCULAR

## 2013-07-17 ENCOUNTER — Encounter: Payer: Self-pay | Admitting: Family Medicine

## 2013-08-07 ENCOUNTER — Ambulatory Visit: Payer: Medicare Other | Admitting: Family Medicine

## 2013-08-14 ENCOUNTER — Ambulatory Visit (INDEPENDENT_AMBULATORY_CARE_PROVIDER_SITE_OTHER): Payer: Medicare Other | Admitting: *Deleted

## 2013-08-14 DIAGNOSIS — E538 Deficiency of other specified B group vitamins: Secondary | ICD-10-CM

## 2013-08-14 MED ORDER — CYANOCOBALAMIN 1000 MCG/ML IJ SOLN
1000.0000 ug | Freq: Once | INTRAMUSCULAR | Status: AC
  Administered 2013-08-14: 1000 ug via INTRAMUSCULAR

## 2013-08-21 ENCOUNTER — Encounter: Payer: Self-pay | Admitting: Internal Medicine

## 2013-08-26 ENCOUNTER — Encounter: Payer: Self-pay | Admitting: Internal Medicine

## 2013-08-26 ENCOUNTER — Ambulatory Visit: Payer: Self-pay | Admitting: Internal Medicine

## 2013-08-30 ENCOUNTER — Ambulatory Visit: Payer: Medicare Other | Admitting: Family Medicine

## 2013-09-14 ENCOUNTER — Ambulatory Visit: Payer: Self-pay | Admitting: Internal Medicine

## 2013-09-17 ENCOUNTER — Ambulatory Visit (INDEPENDENT_AMBULATORY_CARE_PROVIDER_SITE_OTHER): Payer: Medicare Other | Admitting: *Deleted

## 2013-09-17 DIAGNOSIS — E538 Deficiency of other specified B group vitamins: Secondary | ICD-10-CM

## 2013-09-17 MED ORDER — CYANOCOBALAMIN 1000 MCG/ML IJ SOLN
1000.0000 ug | Freq: Once | INTRAMUSCULAR | Status: AC
  Administered 2013-09-17: 1000 ug via INTRAMUSCULAR

## 2013-10-22 ENCOUNTER — Ambulatory Visit (INDEPENDENT_AMBULATORY_CARE_PROVIDER_SITE_OTHER): Payer: Medicare Other

## 2013-10-22 DIAGNOSIS — E538 Deficiency of other specified B group vitamins: Secondary | ICD-10-CM

## 2013-10-22 MED ORDER — CYANOCOBALAMIN 1000 MCG/ML IJ SOLN
1000.0000 ug | Freq: Once | INTRAMUSCULAR | Status: AC
  Administered 2013-10-22: 1000 ug via INTRAMUSCULAR

## 2013-11-13 ENCOUNTER — Encounter: Payer: Self-pay | Admitting: Internal Medicine

## 2013-11-21 ENCOUNTER — Ambulatory Visit (INDEPENDENT_AMBULATORY_CARE_PROVIDER_SITE_OTHER): Payer: Medicare Other | Admitting: *Deleted

## 2013-11-21 DIAGNOSIS — E538 Deficiency of other specified B group vitamins: Secondary | ICD-10-CM

## 2013-11-21 MED ORDER — CYANOCOBALAMIN 1000 MCG/ML IJ SOLN
1000.0000 ug | Freq: Once | INTRAMUSCULAR | Status: AC
  Administered 2013-11-21: 1000 ug via INTRAMUSCULAR

## 2013-12-12 ENCOUNTER — Telehealth: Payer: Self-pay

## 2013-12-12 NOTE — Telephone Encounter (Signed)
error 

## 2013-12-24 ENCOUNTER — Ambulatory Visit (INDEPENDENT_AMBULATORY_CARE_PROVIDER_SITE_OTHER): Payer: Medicare Other | Admitting: *Deleted

## 2013-12-24 DIAGNOSIS — E538 Deficiency of other specified B group vitamins: Secondary | ICD-10-CM

## 2013-12-24 MED ORDER — CYANOCOBALAMIN 1000 MCG/ML IJ SOLN
1000.0000 ug | Freq: Once | INTRAMUSCULAR | Status: AC
  Administered 2013-12-24: 1000 ug via INTRAMUSCULAR

## 2014-01-07 ENCOUNTER — Encounter: Payer: Medicare Other | Admitting: Internal Medicine

## 2014-01-23 ENCOUNTER — Ambulatory Visit (INDEPENDENT_AMBULATORY_CARE_PROVIDER_SITE_OTHER): Payer: Medicare Other | Admitting: *Deleted

## 2014-01-23 DIAGNOSIS — E538 Deficiency of other specified B group vitamins: Secondary | ICD-10-CM

## 2014-01-23 MED ORDER — CYANOCOBALAMIN 1000 MCG/ML IJ SOLN
1000.0000 ug | Freq: Once | INTRAMUSCULAR | Status: AC
  Administered 2014-01-23: 1000 ug via INTRAMUSCULAR

## 2014-02-12 ENCOUNTER — Encounter: Payer: Self-pay | Admitting: *Deleted

## 2014-02-25 ENCOUNTER — Ambulatory Visit (INDEPENDENT_AMBULATORY_CARE_PROVIDER_SITE_OTHER): Payer: Medicare Other | Admitting: *Deleted

## 2014-02-25 ENCOUNTER — Ambulatory Visit: Payer: Medicare Other

## 2014-02-25 DIAGNOSIS — E538 Deficiency of other specified B group vitamins: Secondary | ICD-10-CM

## 2014-02-25 MED ORDER — CYANOCOBALAMIN 1000 MCG/ML IJ SOLN
1000.0000 ug | Freq: Once | INTRAMUSCULAR | Status: AC
  Administered 2014-02-25: 1000 ug via INTRAMUSCULAR

## 2014-04-01 ENCOUNTER — Ambulatory Visit (INDEPENDENT_AMBULATORY_CARE_PROVIDER_SITE_OTHER): Payer: Medicare Other | Admitting: *Deleted

## 2014-04-01 DIAGNOSIS — E538 Deficiency of other specified B group vitamins: Secondary | ICD-10-CM

## 2014-04-24 ENCOUNTER — Ambulatory Visit
Admit: 2014-04-24 | Disposition: A | Discharge status: Home / Self Care | Payer: Self-pay | Attending: Internal Medicine | Admitting: Internal Medicine

## 2014-05-01 ENCOUNTER — Ambulatory Visit (INDEPENDENT_AMBULATORY_CARE_PROVIDER_SITE_OTHER): Payer: Medicare Other | Admitting: *Deleted

## 2014-05-01 DIAGNOSIS — E538 Deficiency of other specified B group vitamins: Secondary | ICD-10-CM

## 2014-05-01 MED ORDER — CYANOCOBALAMIN 1000 MCG/ML IJ SOLN
1000.0000 ug | Freq: Once | INTRAMUSCULAR | Status: AC
  Administered 2014-05-01: 1000 ug via INTRAMUSCULAR

## 2014-05-16 ENCOUNTER — Ambulatory Visit
Admit: 2014-05-16 | Disposition: A | Discharge status: Home / Self Care | Payer: Medicare Other | Attending: Internal Medicine | Admitting: Internal Medicine

## 2014-06-03 ENCOUNTER — Ambulatory Visit (INDEPENDENT_AMBULATORY_CARE_PROVIDER_SITE_OTHER): Payer: Medicare Other | Admitting: *Deleted

## 2014-06-03 ENCOUNTER — Encounter: Payer: Self-pay | Admitting: Internal Medicine

## 2014-06-03 DIAGNOSIS — E538 Deficiency of other specified B group vitamins: Secondary | ICD-10-CM | POA: Diagnosis not present

## 2014-06-03 MED ORDER — CYANOCOBALAMIN 1000 MCG/ML IJ SOLN
1000.0000 ug | Freq: Once | INTRAMUSCULAR | Status: AC
  Administered 2014-06-03: 1000 ug via INTRAMUSCULAR

## 2014-06-03 NOTE — Progress Notes (Signed)
Patient presented today for B 12 injection to left deltoid patient ask injection to be given in right due to painful rotator cuff in left. Injection given in right deltoid and patient tolerated well.

## 2014-06-06 ENCOUNTER — Ambulatory Visit (INDEPENDENT_AMBULATORY_CARE_PROVIDER_SITE_OTHER): Payer: Medicare Other

## 2014-06-06 VITALS — BP 122/78 | HR 67 | Temp 99.1°F | Resp 14 | Ht 67.0 in | Wt 203.0 lb

## 2014-06-06 DIAGNOSIS — Z780 Asymptomatic menopausal state: Secondary | ICD-10-CM | POA: Diagnosis not present

## 2014-06-06 DIAGNOSIS — Z23 Encounter for immunization: Secondary | ICD-10-CM

## 2014-06-06 DIAGNOSIS — Z0000 Encounter for general adult medical examination without abnormal findings: Secondary | ICD-10-CM | POA: Diagnosis not present

## 2014-06-06 NOTE — Patient Instructions (Addendum)
[name] , Thank you for taking time to come for your Medicare Wellness Visit. I appreciate your ongoing commitment to your health goals. Today you given the Prevnar 13 vaccine and Dexa Scan has been ordered.Please review the following plan we discussed and let me know if I can assist you in the future.   These are the goals we discussed: Goals    . Weight < 200 lb (90.719 kg)       This is a list of the screening recommended for you and due dates:  Health Maintenance  Topic Date Due  . Shingles Vaccine  09/11/2006  . DEXA scan (bone density measurement)  09/11/2011  . Pneumonia vaccines (2 of 2 - PCV13) 12/01/2012  . Flu Shot  09/15/2014  . Mammogram  05/03/2015  . Colon Cancer Screening  11/15/2020  . Tetanus Vaccine  02/05/2024    Health Maintenance Adopting a healthy lifestyle and getting preventive care can go a long way to promote health and wellness. Talk with your health care provider about what schedule of regular examinations is right for you. This is a good chance for you to check in with your provider about disease prevention and staying healthy. In between checkups, there are plenty of things you can do on your own. Experts have done a lot of research about which lifestyle changes and preventive measures are most likely to keep you healthy. Ask your health care provider for more information. WEIGHT AND DIET  Eat a healthy diet  Be sure to include plenty of vegetables, fruits, low-fat dairy products, and lean protein.  Do not eat a lot of foods high in solid fats, added sugars, or salt.  Get regular exercise. This is one of the most important things you can do for your health.  Most adults should exercise for at least 150 minutes each week. The exercise should increase your heart rate and make you sweat (moderate-intensity exercise).  Most adults should also do strengthening exercises at least twice a week. This is in addition to the moderate-intensity exercise.   Maintain a healthy weight  Body mass index (BMI) is a measurement that can be used to identify possible weight problems. It estimates body fat based on height and weight. Your health care provider can help determine your BMI and help you achieve or maintain a healthy weight.  For females 28 years of age and older:   A BMI below 18.5 is considered underweight.  A BMI of 18.5 to 24.9 is normal.  A BMI of 25 to 29.9 is considered overweight.  A BMI of 30 and above is considered obese.  Watch levels of cholesterol and blood lipids  You should start having your blood tested for lipids and cholesterol at 68 years of age, then have this test every 5 years.  You may need to have your cholesterol levels checked more often if:  Your lipid or cholesterol levels are high.  You are older than 68 years of age.  You are at high risk for heart disease.  CANCER SCREENING   Lung Cancer  Lung cancer screening is recommended for adults 71-22 years old who are at high risk for lung cancer because of a history of smoking.  A yearly low-dose CT scan of the lungs is recommended for people who:  Currently smoke.  Have quit within the past 15 years.  Have at least a 30-pack-year history of smoking. A pack year is smoking an average of one pack of cigarettes a  day for 1 year.  Yearly screening should continue until it has been 15 years since you quit.  Yearly screening should stop if you develop a health problem that would prevent you from having lung cancer treatment.  Breast Cancer  Practice breast self-awareness. This means understanding how your breasts normally appear and feel.  It also means doing regular breast self-exams. Let your health care provider know about any changes, no matter how small.  If you are in your 20s or 30s, you should have a clinical breast exam (CBE) by a health care provider every 1-3 years as part of a regular health exam.  If you are 64 or older, have a  CBE every year. Also consider having a breast X-ray (mammogram) every year.  If you have a family history of breast cancer, talk to your health care provider about genetic screening.  If you are at high risk for breast cancer, talk to your health care provider about having an MRI and a mammogram every year.  Breast cancer gene (BRCA) assessment is recommended for women who have family members with BRCA-related cancers. BRCA-related cancers include:  Breast.  Ovarian.  Tubal.  Peritoneal cancers.  Results of the assessment will determine the need for genetic counseling and BRCA1 and BRCA2 testing. Cervical Cancer Routine pelvic examinations to screen for cervical cancer are no longer recommended for nonpregnant women who are considered low risk for cancer of the pelvic organs (ovaries, uterus, and vagina) and who do not have symptoms. A pelvic examination may be necessary if you have symptoms including those associated with pelvic infections. Ask your health care provider if a screening pelvic exam is right for you.   The Pap test is the screening test for cervical cancer for women who are considered at risk.  If you had a hysterectomy for a problem that was not cancer or a condition that could lead to cancer, then you no longer need Pap tests.  If you are older than 65 years, and you have had normal Pap tests for the past 10 years, you no longer need to have Pap tests.  If you have had past treatment for cervical cancer or a condition that could lead to cancer, you need Pap tests and screening for cancer for at least 20 years after your treatment.  If you no longer get a Pap test, assess your risk factors if they change (such as having a new sexual partner). This can affect whether you should start being screened again.  Some women have medical problems that increase their chance of getting cervical cancer. If this is the case for you, your health care provider may recommend more  frequent screening and Pap tests.  The human papillomavirus (HPV) test is another test that may be used for cervical cancer screening. The HPV test looks for the virus that can cause cell changes in the cervix. The cells collected during the Pap test can be tested for HPV.  The HPV test can be used to screen women 69 years of age and older. Getting tested for HPV can extend the interval between normal Pap tests from three to five years.  An HPV test also should be used to screen women of any age who have unclear Pap test results.  After 68 years of age, women should have HPV testing as often as Pap tests.  Colorectal Cancer  This type of cancer can be detected and often prevented.  Routine colorectal cancer screening usually begins at 68  years of age and continues through 68 years of age.  Your health care provider may recommend screening at an earlier age if you have risk factors for colon cancer.  Your health care provider may also recommend using home test kits to check for hidden blood in the stool.  A small camera at the end of a tube can be used to examine your colon directly (sigmoidoscopy or colonoscopy). This is done to check for the earliest forms of colorectal cancer.  Routine screening usually begins at age 52.  Direct examination of the colon should be repeated every 5-10 years through 68 years of age. However, you may need to be screened more often if early forms of precancerous polyps or small growths are found. Skin Cancer  Check your skin from head to toe regularly.  Tell your health care provider about any new moles or changes in moles, especially if there is a change in a mole's shape or color.  Also tell your health care provider if you have a mole that is larger than the size of a pencil eraser.  Always use sunscreen. Apply sunscreen liberally and repeatedly throughout the day.  Protect yourself by wearing long sleeves, pants, a wide-brimmed hat, and sunglasses  whenever you are outside. HEART DISEASE, DIABETES, AND HIGH BLOOD PRESSURE   Have your blood pressure checked at least every 1-2 years. High blood pressure causes heart disease and increases the risk of stroke.  If you are between 46 years and 26 years old, ask your health care provider if you should take aspirin to prevent strokes.  Have regular diabetes screenings. This involves taking a blood sample to check your fasting blood sugar level.  If you are at a normal weight and have a low risk for diabetes, have this test once every three years after 68 years of age.  If you are overweight and have a high risk for diabetes, consider being tested at a younger age or more often. PREVENTING INFECTION  Hepatitis B  If you have a higher risk for hepatitis B, you should be screened for this virus. You are considered at high risk for hepatitis B if:  You were born in a country where hepatitis B is common. Ask your health care provider which countries are considered high risk.  Your parents were born in a high-risk country, and you have not been immunized against hepatitis B (hepatitis B vaccine).  You have HIV or AIDS.  You use needles to inject street drugs.  You live with someone who has hepatitis B.  You have had sex with someone who has hepatitis B.  You get hemodialysis treatment.  You take certain medicines for conditions, including cancer, organ transplantation, and autoimmune conditions. Hepatitis C  Blood testing is recommended for:  Everyone born from [birth year] through 1964.  Anyone with known risk factors for hepatitis C. Sexually transmitted infections (STIs)  You should be screened for sexually transmitted infections (STIs) including gonorrhea and chlamydia if:  You are sexually active and are younger than 68 years of age.  You are older than 68 years of age and your health care provider tells you that you are at risk for this type of infection.  Your sexual activity  has changed since you were last screened and you are at an increased risk for chlamydia or gonorrhea. Ask your health care provider if you are at risk.  If you do not have HIV, but are at risk, it may be recommended that you  take a prescription medicine daily to prevent HIV infection. This is called pre-exposure prophylaxis (PrEP). You are considered at risk if:  You are sexually active and do not regularly use condoms or know the HIV status of your partner(s).  You take drugs by injection.  You are sexually active with a partner who has HIV. Talk with your health care provider about whether you are at high risk of being infected with HIV. If you choose to begin PrEP, you should first be tested for HIV. You should then be tested every 3 months for as long as you are taking PrEP.  PREGNANCY   If you are premenopausal and you may become pregnant, ask your health care provider about preconception counseling.  If you may become pregnant, take 400 to 800 micrograms (mcg) of folic acid every day.  If you want to prevent pregnancy, talk to your health care provider about birth control (contraception). OSTEOPOROSIS AND MENOPAUSE   Osteoporosis is a disease in which the bones lose minerals and strength with aging. This can result in serious bone fractures. Your risk for osteoporosis can be identified using a bone density scan.  If you are 82 years of age or older, or if you are at risk for osteoporosis and fractures, ask your health care provider if you should be screened.  Ask your health care provider whether you should take a calcium or vitamin D supplement to lower your risk for osteoporosis.  Menopause may have certain physical symptoms and risks.  Hormone replacement therapy may reduce some of these symptoms and risks. Talk to your health care provider about whether hormone replacement therapy is right for you.  HOME CARE INSTRUCTIONS   Schedule regular health, dental, and eye  exams.  Stay current with your immunizations.   Do not use any tobacco products including cigarettes, chewing tobacco, or electronic cigarettes.  If you are pregnant, do not drink alcohol.  If you are breastfeeding, limit how much and how often you drink alcohol.  Limit alcohol intake to no more than 1 drink per day for nonpregnant women. One drink equals 12 ounces of beer, 5 ounces of wine, or 1 ounces of hard liquor.  Do not use street drugs.  Do not share needles.  Ask your health care provider for help if you need support or information about quitting drugs.  Tell your health care provider if you often feel depressed.  Tell your health care provider if you have ever been abused or do not feel safe at home. Document Released: 08/16/2010 Document Revised: 06/17/2013 Document Reviewed: 01/02/2013 [facility] Patient Information 2015 [place], [place]. This information is not intended to replace advice given to you by your health care provider. Make sure you discuss any questions you have with your health care provider.

## 2014-06-06 NOTE — Progress Notes (Signed)
Subjective:   [name] is a 69 y.o. female who presents for Medicare Annual (Subsequent) preventive examination.  Review of Systems:   Cardiac Risk Factors include: advanced age (>88men, >41 women)     Objective:     Vitals: BP 122/78 mmHg  Pulse 67  Temp(Src) 99.1 F (37.3 C) (Oral)  Resp 14  Ht 5\' 7"  (1.702 m)  Wt 203 lb (92.08 kg)  BMI 31.79 kg/m2  SpO2 97%  Tobacco History  Smoking status  . Never Smoker   Smokeless tobacco  . Never Used     Counseling given: Not Answered   Past Medical History  Diagnosis Date  . Endometrial cancer     s/p hysterectomy   Past Surgical History  Procedure Laterality Date  . Abdominal hysterectomy  2010    Endometrial Ca  . Cholecystectomy  2010   Family History  Problem Relation Age of Onset  . Cancer Father     colon   History  Sexual Activity  . Sexual Activity: Not on file    Outpatient Encounter Prescriptions as of 06/06/2014  Medication Sig  . brimonidine-timolol (COMBIGAN) 0.2-0.5 % ophthalmic solution Place 1 drop into both eyes every 12 (twelve) hours.  . [DISCONTINUED] meclizine (ANTIVERT) 25 MG tablet Take 1 tablet (25 mg total) by mouth 3 (three) times daily as needed.  . [DISCONTINUED] predniSONE (DELTASONE) 10 MG tablet Take 60 mg (6 tablets) on the first day then decrease by 10 mg (1 tablet) daily until done.  . [DISCONTINUED] triamcinolone cream (KENALOG) 0.5 % Apply 1 application topically 2 (two) times daily. To affected areas.    Activities of Daily Living In your present state of health, do you have any difficulty performing the following activities: 06/06/2014 06/06/2014  Hearing? Tempie Donning  Vision? N -  Difficulty concentrating or making decisions? N -  Walking or climbing stairs? N -  Dressing or bathing? N -  Doing errands, shopping? N -  Preparing Food and eating ? N -  Using the Toilet? N -  In the past six months, have you accidently leaked urine? N -  Do you have problems with loss  of bowel control? N -  Managing your Medications? N -  Managing your Finances? N -  Housekeeping or managing your Housekeeping? N -    Patient Care Team: Jackolyn Confer, MD as PCP - General (Internal Medicine)    Assessment:     Exercise Activities and Dietary recommendations Current Exercise Habits:: Structured exercise class, Type of exercise: strength training/weights;treadmill, Time (Minutes): 60, Frequency (Times/Week): 3, Weekly Exercise (Minutes/Week): 180, Intensity: Moderate  Goals    . Weight < 200 lb (90.719 kg)      Fall Risk Fall Risk  06/06/2014 12/31/2012  Falls in the past year? No No   Depression Screen PHQ 2/9 Scores 06/06/2014 12/31/2012  PHQ - 2 Score 0 0     Cognitive Testing MMSE - Mini Mental State Exam 06/06/2014  Orientation to time 5  Orientation to Place 5  Registration 3  Attention/ Calculation 5  Recall 3  Language- name 2 objects 2  Language- repeat 1  Language- follow 3 step command 3  Language- read & follow direction 1  Write a sentence 1  Copy design 1  Total score 30    Immunization History  Administered Date(s) Administered  . Influenza Split 02/04/2014  . Pneumococcal Polysaccharide-23 12/02/2011  . Tdap 02/04/2014   Screening Tests Health Maintenance  Topic  Date Due  . ZOSTAVAX  09/11/2006  . DEXA SCAN  09/11/2011  . PNA vac Low Risk Adult (2 of 2 - PCV13) 12/01/2012  . INFLUENZA VACCINE  09/15/2014  . MAMMOGRAM  05/03/2015  . COLONOSCOPY  11/15/2020  . TETANUS/TDAP  02/05/2024      Plan:    During the course of the visit the patient was educated and counseled about the following appropriate screening and preventive services:   Vaccines to include Pneumoccal, Influenza, Hepatitis B, Td, Zostavax, HCV  Electrocardiogram  Cardiovascular Disease  Colorectal cancer screening  Bone density screening  Diabetes screening  Glaucoma screening  Mammography/PAP  Nutrition counseling   Patient Instructions  (the written plan) was given to the patient.   Geni Bers, LPN  [id number]

## 2014-06-07 NOTE — Progress Notes (Signed)
Annual Wellness Visit as completed by Health Coach was reviewed in full.  

## 2014-06-11 ENCOUNTER — Ambulatory Visit: Payer: Self-pay | Admitting: Internal Medicine

## 2014-07-04 ENCOUNTER — Encounter: Payer: Self-pay | Admitting: Internal Medicine

## 2014-07-04 ENCOUNTER — Ambulatory Visit (INDEPENDENT_AMBULATORY_CARE_PROVIDER_SITE_OTHER): Payer: Medicare Other | Admitting: Internal Medicine

## 2014-07-04 VITALS — BP 131/86 | HR 74 | Temp 97.8°F | Ht 65.75 in | Wt 202.4 lb

## 2014-07-04 DIAGNOSIS — R7989 Other specified abnormal findings of blood chemistry: Secondary | ICD-10-CM

## 2014-07-04 DIAGNOSIS — E538 Deficiency of other specified B group vitamins: Secondary | ICD-10-CM

## 2014-07-04 DIAGNOSIS — Z0000 Encounter for general adult medical examination without abnormal findings: Secondary | ICD-10-CM

## 2014-07-04 DIAGNOSIS — E669 Obesity, unspecified: Secondary | ICD-10-CM | POA: Diagnosis not present

## 2014-07-04 LAB — LIPID PANEL
CHOL/HDL RATIO: 4
CHOLESTEROL: 238 mg/dL — AB (ref 0–200)
HDL: 53.9 mg/dL (ref >39.00)
NonHDL: 184.1
Triglycerides: 209 mg/dL — ABNORMAL HIGH (ref 0.0–149.0)
VLDL: 41.8 mg/dL — ABNORMAL HIGH (ref 0.0–40.0)

## 2014-07-04 LAB — COMPREHENSIVE METABOLIC PANEL
ALT: 17 U/L (ref 0–35)
AST: 25 U/L (ref 0–37)
Albumin: 4.1 g/dL (ref 3.5–5.2)
Alkaline Phosphatase: 94 U/L (ref 39–117)
BUN: 18 mg/dL (ref 6–23)
CHLORIDE: 103 meq/L (ref 96–112)
CO2: 28 mEq/L (ref 19–32)
Calcium: 8.9 mg/dL (ref 8.4–10.5)
Creatinine, Ser: 0.77 mg/dL (ref 0.40–1.20)
GFR: 79.28 mL/min (ref >60.00)
Glucose, Bld: 81 mg/dL (ref 70–99)
Potassium: 4 mEq/L (ref 3.5–5.1)
Sodium: 139 mEq/L (ref 135–145)
Total Bilirubin: 0.5 mg/dL (ref 0.2–1.2)
Total Protein: 6.7 g/dL (ref 6.0–8.3)

## 2014-07-04 LAB — CBC WITH DIFFERENTIAL/PLATELET
Basophils Absolute: 0 10*3/uL (ref 0.0–0.1)
Basophils Relative: 0.4 % (ref 0.0–3.0)
Eosinophils Absolute: 0.2 10*3/uL (ref 0.0–0.7)
Eosinophils Relative: 2.1 % (ref 0.0–5.0)
HCT: 42.5 % (ref 36.0–46.0)
HEMOGLOBIN: 14.6 g/dL (ref 12.0–15.0)
LYMPHS ABS: 1.8 10*3/uL (ref 0.7–4.0)
Lymphocytes Relative: 23.1 % (ref 12.0–46.0)
MCHC: 34.2 g/dL (ref 30.0–36.0)
MCV: 89.3 fl (ref 78.0–100.0)
Monocytes Absolute: 0.5 10*3/uL (ref 0.1–1.0)
Monocytes Relative: 6.3 % (ref 3.0–12.0)
NEUTROS ABS: 5.3 10*3/uL (ref 1.4–7.7)
Neutrophils Relative %: 68.1 % (ref 43.0–77.0)
Platelets: 253 10*3/uL (ref 150.0–400.0)
RBC: 4.76 Mil/uL (ref 3.87–5.11)
RDW: 12.7 % (ref 11.5–15.5)
WBC: 7.8 10*3/uL (ref 4.0–10.5)

## 2014-07-04 LAB — VITAMIN D 25 HYDROXY (VIT D DEFICIENCY, FRACTURES): VITD: 31.36 ng/mL (ref 30.00–100.00)

## 2014-07-04 LAB — MICROALBUMIN / CREATININE URINE RATIO
Creatinine,U: 61.5 mg/dL
Microalb Creat Ratio: 1.1 mg/g (ref 0.0–30.0)
Microalb, Ur: <0.7 mg/dL (ref 0.0–1.9)

## 2014-07-04 LAB — LDL CHOLESTEROL, DIRECT: LDL DIRECT: 140 mg/dL

## 2014-07-04 LAB — TSH: TSH: 1.02 u[IU]/mL (ref 0.35–4.50)

## 2014-07-04 MED ORDER — CYANOCOBALAMIN 1000 MCG/ML IJ SOLN
1000.0000 ug | Freq: Once | INTRAMUSCULAR | Status: AC
  Administered 2014-07-04: 1000 ug via INTRAMUSCULAR

## 2014-07-04 NOTE — Assessment & Plan Note (Signed)
General medical exam including breast exam normal today. PAP and pelvic deferred as pt s/p hysterectomy. Mammogram UTD, plan repeat in 2017. Colonoscopy UTD. Immunizations UTD. Labs today as ordered. Encouraged healthy diet and exercise. Follow up in 1 year and sooner as needed.

## 2014-07-04 NOTE — Patient Instructions (Addendum)
Consider reading the book "Always Hungry" by [facility] Maintenance Adopting a healthy lifestyle and getting preventive care can go a long way to promote health and wellness. Talk with your health care provider about what schedule of regular examinations is right for you. This is a good chance for you to check in with your provider about disease prevention and staying healthy. In between checkups, there are plenty of things you can do on your own. Experts have done a lot of research about which lifestyle changes and preventive measures are most likely to keep you healthy. Ask your health care provider for more information. WEIGHT AND DIET  Eat a healthy diet  Be sure to include plenty of vegetables, fruits, low-fat dairy products, and lean protein.  Do not eat a lot of foods high in solid fats, added sugars, or salt.  Get regular exercise. This is one of the most important things you can do for your health.  Most adults should exercise for at least 150 minutes each week. The exercise should increase your heart rate and make you sweat (moderate-intensity exercise).  Most adults should also do strengthening exercises at least twice a week. This is in addition to the moderate-intensity exercise.  Maintain a healthy weight  Body mass index (BMI) is a measurement that can be used to identify possible weight problems. It estimates body fat based on height and weight. Your health care provider can help determine your BMI and help you achieve or maintain a healthy weight.  For females 74 years of age and older:   A BMI below 18.5 is considered underweight.  A BMI of 18.5 to 24.9 is normal.  A BMI of 25 to 29.9 is considered overweight.  A BMI of 30 and above is considered obese.  Watch levels of cholesterol and blood lipids  You should start having your blood tested for lipids and cholesterol at 68 years of age, then have this test every 5 years.  You may need to have your  cholesterol levels checked more often if:  Your lipid or cholesterol levels are high.  You are older than 68 years of age.  You are at high risk for heart disease.  CANCER SCREENING   Lung Cancer  Lung cancer screening is recommended for adults 74-27 years old who are at high risk for lung cancer because of a history of smoking.  A yearly low-dose CT scan of the lungs is recommended for people who:  Currently smoke.  Have quit within the past 15 years.  Have at least a 30-pack-year history of smoking. A pack year is smoking an average of one pack of cigarettes a day for 1 year.  Yearly screening should continue until it has been 15 years since you quit.  Yearly screening should stop if you develop a health problem that would prevent you from having lung cancer treatment.  Breast Cancer  Practice breast self-awareness. This means understanding how your breasts normally appear and feel.  It also means doing regular breast self-exams. Let your health care provider know about any changes, no matter how small.  If you are in your 20s or 30s, you should have a clinical breast exam (CBE) by a health care provider every 1-3 years as part of a regular health exam.  If you are 59 or older, have a CBE every year. Also consider having a breast X-ray (mammogram) every year.  If you have a family history of breast cancer, talk to your  health care provider about genetic screening.  If you are at high risk for breast cancer, talk to your health care provider about having an MRI and a mammogram every year.  Breast cancer gene (BRCA) assessment is recommended for women who have family members with BRCA-related cancers. BRCA-related cancers include:  Breast.  Ovarian.  Tubal.  Peritoneal cancers.  Results of the assessment will determine the need for genetic counseling and BRCA1 and BRCA2 testing. Cervical Cancer Routine pelvic examinations to screen for cervical cancer are no  longer recommended for nonpregnant women who are considered low risk for cancer of the pelvic organs (ovaries, uterus, and vagina) and who do not have symptoms. A pelvic examination may be necessary if you have symptoms including those associated with pelvic infections. Ask your health care provider if a screening pelvic exam is right for you.   The Pap test is the screening test for cervical cancer for women who are considered at risk.  If you had a hysterectomy for a problem that was not cancer or a condition that could lead to cancer, then you no longer need Pap tests.  If you are older than 65 years, and you have had normal Pap tests for the past 10 years, you no longer need to have Pap tests.  If you have had past treatment for cervical cancer or a condition that could lead to cancer, you need Pap tests and screening for cancer for at least 20 years after your treatment.  If you no longer get a Pap test, assess your risk factors if they change (such as having a new sexual partner). This can affect whether you should start being screened again.  Some women have medical problems that increase their chance of getting cervical cancer. If this is the case for you, your health care provider may recommend more frequent screening and Pap tests.  The human papillomavirus (HPV) test is another test that may be used for cervical cancer screening. The HPV test looks for the virus that can cause cell changes in the cervix. The cells collected during the Pap test can be tested for HPV.  The HPV test can be used to screen women 35 years of age and older. Getting tested for HPV can extend the interval between normal Pap tests from three to five years.  An HPV test also should be used to screen women of any age who have unclear Pap test results.  After 68 years of age, women should have HPV testing as often as Pap tests.  Colorectal Cancer  This type of cancer can be detected and often  prevented.  Routine colorectal cancer screening usually begins at 68 years of age and continues through 68 years of age.  Your health care provider may recommend screening at an earlier age if you have risk factors for colon cancer.  Your health care provider may also recommend using home test kits to check for hidden blood in the stool.  A small camera at the end of a tube can be used to examine your colon directly (sigmoidoscopy or colonoscopy). This is done to check for the earliest forms of colorectal cancer.  Routine screening usually begins at age 76.  Direct examination of the colon should be repeated every 5-10 years through 68 years of age. However, you may need to be screened more often if early forms of precancerous polyps or small growths are found. Skin Cancer  Check your skin from head to toe regularly.  Tell your  health care provider about any new moles or changes in moles, especially if there is a change in a mole's shape or color.  Also tell your health care provider if you have a mole that is larger than the size of a pencil eraser.  Always use sunscreen. Apply sunscreen liberally and repeatedly throughout the day.  Protect yourself by wearing long sleeves, pants, a wide-brimmed hat, and sunglasses whenever you are outside. HEART DISEASE, DIABETES, AND HIGH BLOOD PRESSURE   Have your blood pressure checked at least every 1-2 years. High blood pressure causes heart disease and increases the risk of stroke.  If you are between 73 years and 92 years old, ask your health care provider if you should take aspirin to prevent strokes.  Have regular diabetes screenings. This involves taking a blood sample to check your fasting blood sugar level.  If you are at a normal weight and have a low risk for diabetes, have this test once every three years after 68 years of age.  If you are overweight and have a high risk for diabetes, consider being tested at a younger age or more  often. PREVENTING INFECTION  Hepatitis B  If you have a higher risk for hepatitis B, you should be screened for this virus. You are considered at high risk for hepatitis B if:  You were born in a country where hepatitis B is common. Ask your health care provider which countries are considered high risk.  Your parents were born in a high-risk country, and you have not been immunized against hepatitis B (hepatitis B vaccine).  You have HIV or AIDS.  You use needles to inject street drugs.  You live with someone who has hepatitis B.  You have had sex with someone who has hepatitis B.  You get hemodialysis treatment.  You take certain medicines for conditions, including cancer, organ transplantation, and autoimmune conditions. Hepatitis C  Blood testing is recommended for:  Everyone born from [birth year] through 1964.  Anyone with known risk factors for hepatitis C. Sexually transmitted infections (STIs)  You should be screened for sexually transmitted infections (STIs) including gonorrhea and chlamydia if:  You are sexually active and are younger than 68 years of age.  You are older than 68 years of age and your health care provider tells you that you are at risk for this type of infection.  Your sexual activity has changed since you were last screened and you are at an increased risk for chlamydia or gonorrhea. Ask your health care provider if you are at risk.  If you do not have HIV, but are at risk, it may be recommended that you take a prescription medicine daily to prevent HIV infection. This is called pre-exposure prophylaxis (PrEP). You are considered at risk if:  You are sexually active and do not regularly use condoms or know the HIV status of your partner(s).  You take drugs by injection.  You are sexually active with a partner who has HIV. Talk with your health care provider about whether you are at high risk of being infected with HIV. If you choose to begin PrEP, you  should first be tested for HIV. You should then be tested every 3 months for as long as you are taking PrEP.  PREGNANCY   If you are premenopausal and you may become pregnant, ask your health care provider about preconception counseling.  If you may become pregnant, take 400 to 800 micrograms (mcg) of folic acid every day.  If you want to prevent pregnancy, talk to your health care provider about birth control (contraception). OSTEOPOROSIS AND MENOPAUSE   Osteoporosis is a disease in which the bones lose minerals and strength with aging. This can result in serious bone fractures. Your risk for osteoporosis can be identified using a bone density scan.  If you are 7 years of age or older, or if you are at risk for osteoporosis and fractures, ask your health care provider if you should be screened.  Ask your health care provider whether you should take a calcium or vitamin D supplement to lower your risk for osteoporosis.  Menopause may have certain physical symptoms and risks.  Hormone replacement therapy may reduce some of these symptoms and risks. Talk to your health care provider about whether hormone replacement therapy is right for you.  HOME CARE INSTRUCTIONS   Schedule regular health, dental, and eye exams.  Stay current with your immunizations.   Do not use any tobacco products including cigarettes, chewing tobacco, or electronic cigarettes.  If you are pregnant, do not drink alcohol.  If you are breastfeeding, limit how much and how often you drink alcohol.  Limit alcohol intake to no more than 1 drink per day for nonpregnant women. One drink equals 12 ounces of beer, 5 ounces of wine, or 1 ounces of hard liquor.  Do not use street drugs.  Do not share needles.  Ask your health care provider for help if you need support or information about quitting drugs.  Tell your health care provider if you often feel depressed.  Tell your health care provider if you have ever  been abused or do not feel safe at home. Document Released: 08/16/2010 Document Revised: 06/17/2013 Document Reviewed: 01/02/2013 [facility] Patient Information 2015 [place], [place]. This information is not intended to replace advice given to you by your health care provider. Make sure you discuss any questions you have with your health care provider.

## 2014-07-04 NOTE — Progress Notes (Signed)
Pre visit review using our clinic review tool, if applicable. No additional management support is needed unless otherwise documented below in the visit note. 

## 2014-07-04 NOTE — Progress Notes (Signed)
Subjective:    Patient ID: [name], female    DOB: [birth year], 69 y.o.   MRN: [id number]  HPI  69YO female presents for physical exam.  Continues to follow healthy diet. Working with nutritionist. Exercising regularly with Clinical research associate. Frustrated by lack of weight loss. Generally feeling well. No other concerns today.  Wt Readings from Last 3 Encounters:  07/04/14 202 lb 6 oz (91.797 kg)  06/06/14 203 lb (92.08 kg)  07/10/13 189 lb (85.73 kg)    Past medical, surgical, family and social history per today's encounter.  Review of Systems  Constitutional: Negative for fever, chills, appetite change, fatigue and unexpected weight change.  Eyes: Negative for visual disturbance.  Respiratory: Negative for shortness of breath.   Cardiovascular: Negative for chest pain and leg swelling.  Gastrointestinal: Negative for vomiting, abdominal pain, diarrhea and constipation.  Musculoskeletal: Negative for myalgias and arthralgias.  Skin: Negative for color change and rash.  Hematological: Negative for adenopathy. Does not bruise/bleed easily.  Psychiatric/Behavioral: Negative for sleep disturbance and dysphoric mood. The patient is not nervous/anxious.        Objective:    BP 131/86 mmHg  Pulse 74  Temp(Src) 97.8 F (36.6 C) (Oral)  Ht 5' 5.75" (1.67 m)  Wt 202 lb 6 oz (91.797 kg)  BMI 32.92 kg/m2  SpO2 100% Physical Exam  Constitutional: She is oriented to person, place, and time. She appears well-developed and well-nourished. No distress.  HENT:  Head: Normocephalic and atraumatic.  Right Ear: External ear normal.  Left Ear: External ear normal.  Nose: Nose normal.  Mouth/Throat: Oropharynx is clear and moist. No oropharyngeal exudate.  Eyes: Conjunctivae are normal. Pupils are equal, round, and reactive to light. Right eye exhibits no discharge. Left eye exhibits no discharge. No scleral icterus.  Neck: Normal range of motion. Neck supple. No tracheal deviation present.  No thyromegaly present.  Cardiovascular: Normal rate, regular rhythm, normal heart sounds and intact distal pulses.  Exam reveals no gallop and no friction rub.   No murmur heard. Pulmonary/Chest: Effort normal and breath sounds normal. No accessory muscle usage. No tachypnea. No respiratory distress. She has no decreased breath sounds. She has no wheezes. She has no rales. She exhibits no tenderness. Right breast exhibits no inverted nipple, no mass, no nipple discharge, no skin change and no tenderness. Left breast exhibits no inverted nipple, no mass, no nipple discharge, no skin change and no tenderness. Breasts are symmetrical.  Abdominal: Soft. Bowel sounds are normal. She exhibits no distension and no mass. There is no tenderness. There is no rebound and no guarding.  Musculoskeletal: Normal range of motion. She exhibits no edema or tenderness.  Lymphadenopathy:    She has no cervical adenopathy.  Neurological: She is alert and oriented to person, place, and time. No cranial nerve deficit. She exhibits normal muscle tone. Coordination normal.  Skin: Skin is warm and dry. No rash noted. She is not diaphoretic. No erythema. No pallor.  Psychiatric: She has a normal mood and affect. Her behavior is normal. Judgment and thought content normal.          Assessment & Plan:   Problem List Items Addressed This Visit      Unprioritized   Obesity (BMI 30-39.9)    Wt Readings from Last 3 Encounters:  07/04/14 202 lb 6 oz (91.797 kg)  06/06/14 203 lb (92.08 kg)  07/10/13 189 lb (85.73 kg)   Body mass index is 32.92 kg/(m^2). Encouraged continued effort at  healthy diet and exercise. Discussed potentially limiting intake of carbohydrates to less than 30% of caloric intake and increasing intake of fat and protein. She will continue to work with nutritionist.      Routine general medical examination at a health care facility - Primary    General medical exam including breast exam normal  today. PAP and pelvic deferred as pt s/p hysterectomy. Mammogram UTD, plan repeat in 2017. Colonoscopy UTD. Immunizations UTD. Labs today as ordered. Encouraged healthy diet and exercise. Follow up in 1 year and sooner as needed.      Relevant Orders   TSH   CBC with Differential/Platelet   Comprehensive metabolic panel   Lipid panel   Vit D  25 hydroxy (rtn osteoporosis monitoring)   Microalbumin / creatinine urine ratio    Other Visit Diagnoses    B12 deficiency        Relevant Medications    cyanocobalamin ((VITAMIN B-12)) injection 1,000 mcg (Completed)        Return in about 1 year (around 07/04/2015) for Physical.

## 2014-07-04 NOTE — Assessment & Plan Note (Signed)
Wt Readings from Last 3 Encounters:  07/04/14 202 lb 6 oz (91.797 kg)  06/06/14 203 lb (92.08 kg)  07/10/13 189 lb (85.73 kg)   Body mass index is 32.92 kg/(m^2). Encouraged continued effort at healthy diet and exercise. Discussed potentially limiting intake of carbohydrates to less than 30% of caloric intake and increasing intake of fat and protein. She will continue to work with nutritionist.

## 2014-07-08 ENCOUNTER — Ambulatory Visit: Payer: Medicare Other

## 2014-07-15 ENCOUNTER — Other Ambulatory Visit: Payer: Self-pay | Admitting: Internal Medicine

## 2014-07-15 ENCOUNTER — Encounter: Payer: Self-pay | Admitting: Internal Medicine

## 2014-07-15 ENCOUNTER — Ambulatory Visit
Admission: RE | Admit: 2014-07-15 | Discharge: 2014-07-15 | Disposition: A | Discharge status: Home / Self Care | Payer: Medicare Other | Source: Ambulatory Visit | Attending: Internal Medicine | Admitting: Internal Medicine

## 2014-07-15 DIAGNOSIS — Z780 Asymptomatic menopausal state: Secondary | ICD-10-CM | POA: Insufficient documentation

## 2014-07-15 DIAGNOSIS — M85852 Other specified disorders of bone density and structure, left thigh: Secondary | ICD-10-CM | POA: Diagnosis not present

## 2014-08-26 ENCOUNTER — Ambulatory Visit (INDEPENDENT_AMBULATORY_CARE_PROVIDER_SITE_OTHER): Payer: Medicare Other

## 2014-08-26 DIAGNOSIS — E538 Deficiency of other specified B group vitamins: Secondary | ICD-10-CM

## 2014-08-26 MED ORDER — CYANOCOBALAMIN 1000 MCG/ML IJ SOLN
1000.0000 ug | Freq: Once | INTRAMUSCULAR | Status: AC
  Administered 2014-08-26: 1000 ug via INTRAMUSCULAR

## 2014-09-30 ENCOUNTER — Ambulatory Visit (INDEPENDENT_AMBULATORY_CARE_PROVIDER_SITE_OTHER): Payer: Medicare Other

## 2014-09-30 DIAGNOSIS — E538 Deficiency of other specified B group vitamins: Secondary | ICD-10-CM

## 2014-09-30 MED ORDER — CYANOCOBALAMIN 1000 MCG/ML IJ SOLN
1000.0000 ug | Freq: Once | INTRAMUSCULAR | Status: AC
  Administered 2014-09-30: 1000 ug via INTRAMUSCULAR

## 2014-09-30 NOTE — Progress Notes (Signed)
Patient came in for monthly b12 injection.  Received in Left deltoid.  Patient tolerated well.  

## 2014-10-06 ENCOUNTER — Ambulatory Visit: Payer: Self-pay | Admitting: Internal Medicine

## 2014-10-06 ENCOUNTER — Ambulatory Visit (INDEPENDENT_AMBULATORY_CARE_PROVIDER_SITE_OTHER): Payer: Medicare Other | Admitting: Internal Medicine

## 2014-10-06 ENCOUNTER — Encounter: Payer: Self-pay | Admitting: Internal Medicine

## 2014-10-06 VITALS — BP 131/82 | HR 69 | Temp 98.1°F | Ht 65.75 in | Wt 203.4 lb

## 2014-10-06 DIAGNOSIS — M25551 Pain in right hip: Secondary | ICD-10-CM | POA: Diagnosis not present

## 2014-10-06 MED ORDER — MELOXICAM 15 MG PO TABS: 15.0000 mg | ORAL_TABLET | Freq: Every day | ORAL | Status: DC

## 2014-10-06 NOTE — Progress Notes (Signed)
Pre visit review using our clinic review tool, if applicable. No additional management support is needed unless otherwise documented below in the visit note. 

## 2014-10-06 NOTE — Assessment & Plan Note (Signed)
Right lateral hip pain. Suspect OA and bursitis contributing. Will start Meloxicam. Will set up evaluation with Dr. Tamala Julian. Question if US imaging and possibly steroid injection might be helpful. Follow up 4 weeks.

## 2014-10-06 NOTE — Progress Notes (Signed)
Subjective:    Patient ID: [name], female    DOB: 02-11-1946, 69 y.o.   MRN: [id number]  HPI  69YO female presents for acute visit.  Hip pain - Seen in past by Dr. Lorelei Pont. Symptoms improved. Now with pain both lateral right hip and radiating pain down right leg. Unable to lie on right side at night. Pain improved with exercise/stretching. Pain is worsened with prolonged walking.Feels weaker at times. No leg weakness. Notes increased stiffness in hip and back.  Xray in 2014 showed mild degenerative changes. Taking Advil with some improvement.  Past Medical History  Diagnosis Date  . Endometrial cancer     s/p hysterectomy   Family History  Problem Relation Age of Onset  . Cancer Father     colon   Past Surgical History  Procedure Laterality Date  . Abdominal hysterectomy  2010    Endometrial Ca  . Cholecystectomy  2010   Social History   Social History  . Marital Status: Single    Spouse Name: N/A  . Number of Children: 1  . Years of Education: N/A   Occupational History  . Retired    Social History Main Topics  . Smoking status: Never Smoker   . Smokeless tobacco: Never Used  . Alcohol Use: Yes     Comment: Rarely  . Drug Use: No  . Sexual Activity: Not Asked   Other Topics Concern  . None   Social History Narrative   Lives with mother in [place]. 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 Exercises regularly and follows a healthy diet.             Review of Systems  Constitutional: Negative for fever, chills, appetite change, fatigue and unexpected weight change.  Eyes: Negative for visual disturbance.  Respiratory: Negative for shortness of breath.   Cardiovascular: Negative for chest pain and leg swelling.  Gastrointestinal: Negative for abdominal pain.  Musculoskeletal: Positive for myalgias, back pain and arthralgias.  Skin: Negative for color change and rash.  Neurological: Negative for weakness and numbness.  Hematological: Negative for  adenopathy. Does not bruise/bleed easily.  Psychiatric/Behavioral: Negative for dysphoric mood. The patient is not nervous/anxious.        Objective:    BP 131/82 mmHg  Pulse 69  Temp(Src) 98.1 F (36.7 C) (Oral)  Ht 5' 5.75" (1.67 m)  Wt 203 lb 6 oz (92.25 kg)  BMI 33.08 kg/m2  SpO2 98% Physical Exam  Constitutional: She is oriented to person, place, and time. She appears well-developed and well-nourished. No distress.  HENT:  Head: Normocephalic and atraumatic.  Right Ear: External ear normal.  Left Ear: External ear normal.  Nose: Nose normal.  Mouth/Throat: Oropharynx is clear and moist.  Eyes: Conjunctivae are normal. Pupils are equal, round, and reactive to light. Right eye exhibits no discharge. Left eye exhibits no discharge. No scleral icterus.  Neck: Normal range of motion. Neck supple. No tracheal deviation present. No thyromegaly present.  Pulmonary/Chest: Effort normal. No accessory muscle usage. No tachypnea. She has no decreased breath sounds. She has no rhonchi.  Musculoskeletal: Normal range of motion. She exhibits no edema or tenderness.       Right hip: She exhibits normal range of motion and normal strength.       Legs: Lymphadenopathy:    She has no cervical adenopathy.  Neurological: She is alert and oriented to person, place, and time. No cranial nerve deficit. She exhibits normal muscle tone. Coordination normal.  Skin:  Skin is warm and dry. No rash noted. She is not diaphoretic. No erythema. No pallor.  Psychiatric: She has a normal mood and affect. Her behavior is normal. Judgment and thought content normal.          Assessment & Plan:   Problem List Items Addressed This Visit      Unprioritized   Right hip pain - Primary    Right lateral hip pain. Suspect OA and bursitis contributing. Will start Meloxicam. Will set up evaluation with Dr. Tamala Julian. Question if US imaging and possibly steroid injection might be helpful. Follow up 4 weeks.       Relevant Medications   meloxicam (MOBIC) 15 MG tablet   Other Relevant Orders   AMB referral to sports medicine       Return in about 4 weeks (around 11/03/2014) for Recheck.

## 2014-10-06 NOTE — Patient Instructions (Signed)
Start Meloxicam 15mg  daily.  Ice right hip 20-22min several times per day.  We will set up sports medicine evaluation.

## 2014-11-04 ENCOUNTER — Encounter: Payer: Self-pay | Admitting: Family Medicine

## 2014-11-04 ENCOUNTER — Ambulatory Visit (INDEPENDENT_AMBULATORY_CARE_PROVIDER_SITE_OTHER): Payer: Medicare Other | Admitting: Family Medicine

## 2014-11-04 ENCOUNTER — Ambulatory Visit: Payer: Medicare Other

## 2014-11-04 VITALS — BP 132/84 | HR 70 | Ht 65.75 in | Wt 203.0 lb

## 2014-11-04 DIAGNOSIS — M7062 Trochanteric bursitis, left hip: Secondary | ICD-10-CM | POA: Diagnosis not present

## 2014-11-04 NOTE — Progress Notes (Signed)
Pre visit review using our clinic review tool, if applicable. No additional management support is needed unless otherwise documented below in the visit note. 

## 2014-11-04 NOTE — Patient Instructions (Addendum)
Good to see you Ice 20 minutes 2 times daily. Usually after activity and before bed. Exercises 3 times a week especially after working out.  pennsaid pinkie amount topically 2 times daily as needed.  Consider turmeric 500mg  twice daily Wear good shoes with a lot walking  With lifting keep hands within peripheral vision.  See me again in 4 weeks.

## 2014-11-04 NOTE — Assessment & Plan Note (Signed)
I do believe the patient does have intermittent bursitis of the right hip. Patient seems to do well when she decreases certain activities. We discussed the possibility of an injection which patient declined today. Patient try topical anti-inflammatory's, icing protocol, range of motion exercises and patient work with Product/process development scientist today. We discussed Her shoewear. We discussed the importance of hip abductor strengthening. Patient will try these changes and come back and see me again in 3-4 weeks.

## 2014-11-04 NOTE — Progress Notes (Signed)
[facility] [address] [place], [place] [address] Phone: [phone] Subjective:    I'm seeing this patient by the request  of:  Rica Mast, MD   CC: Right hip pain  [id number] [name] is a 69 y.o. female coming in with complaint of right hip pain.  Been intermittently for months at this time.  Seems to get worse with increasing activity. States mild radiation, no numbness, no weakness.  Can hurt with laying on the side.  Gets better without doing other activity.  Does hurt after sitting a long amount of time.  No groin pain.  Rates severity at worse 6/10.  Continues to workout regularly.  Tried nothing OTC to help with the pain.   Past Medical History  Diagnosis Date  . Endometrial cancer     s/p hysterectomy   Past Surgical History  Procedure Laterality Date  . Abdominal hysterectomy  2010    Endometrial Ca  . Cholecystectomy  2010   Social History  Substance Use Topics  . Smoking status: Never Smoker   . Smokeless tobacco: Never Used  . Alcohol Use: Yes     Comment: Rarely   No Known Allergies Family History  Problem Relation Age of Onset  . Cancer Father     colon       Past medical history, social, surgical and family history all reviewed in electronic medical record.   Review of Systems: No headache, visual changes, nausea, vomiting, diarrhea, constipation, dizziness, abdominal pain, skin rash, fevers, chills, night sweats, weight loss, swollen lymph nodes, body aches, joint swelling, muscle aches, chest pain, shortness of breath, mood changes.   Objective Blood pressure 132/84, pulse 70, height 5' 5.75" (1.67 m), weight 203 lb (92.08 kg), SpO2 95 %.  General: No apparent distress alert and oriented x3 mood and affect normal, dressed appropriately.  HEENT: Pupils equal, extraocular movements intact  Respiratory: Patient's speak in full sentences and does not appear short of breath  Cardiovascular: No  lower extremity edema, non tender, no erythema  Skin: Warm dry intact with no signs of infection or rash on extremities or on axial skeleton.  Abdomen: Soft nontender  Neuro: Cranial nerves II through XII are intact, neurovascularly intact in all extremities with 2+ DTRs and 2+ pulses.  Lymph: No lymphadenopathy of posterior or anterior cervical chain or axillae bilaterally.  Gait normal with good balance and coordination.  MSK:  Non tender with full range of motion and good stability and symmetric strength and tone of shoulders, elbows, wrist,  knee and ankles bilaterally.  Hip: Right ROM IR: 15 Deg, ER: 45 Deg, Flexion: 120 Deg, Extension: 100 Deg, Abduction: 35 Deg, Adduction: 35 Deg Strength IR: 5/5, ER: 5/5, Flexion: 5/5, Extension: 5/5, Abduction: 4/5, Adduction: 5/5 Pelvic alignment unremarkable to inspection and palpation. Standing hip rotation and gait without trendelenburg sign / unsteadiness. Minimal discomfort over the greater trochanteric bursa Mild pain with Corky Sox No SI joint tenderness and normal minimal SI movement. Procedure note 14431; 15 minutes spent for Therapeutic exercises as stated in above notes.  This included exercises focusing on stretching, strengthening, with significant focus on eccentric aspects. Hip strengthening exercises which included:  Pelvic tilt/bracing to help with proper recruitment of the lower abs and pelvic floor muscles  Glute strengthening to properly contract glutes without over-engaging low back and hamstrings - prone hip extension and glute bridge exercises Proper stretching techniques to increase effectiveness for the hip flexors, groin, quads, piriformis and  low back when appropriate    Proper technique shown and discussed handout in great detail with ATC.  All questions were discussed and answered.      Impression and Recommendations:     This case required medical decision making of moderate complexity.

## 2014-11-05 ENCOUNTER — Ambulatory Visit (INDEPENDENT_AMBULATORY_CARE_PROVIDER_SITE_OTHER): Payer: Medicare Other

## 2014-11-05 DIAGNOSIS — E538 Deficiency of other specified B group vitamins: Secondary | ICD-10-CM | POA: Diagnosis not present

## 2014-11-05 MED ORDER — CYANOCOBALAMIN 1000 MCG/ML IJ SOLN
1000.0000 ug | Freq: Once | INTRAMUSCULAR | Status: AC
  Administered 2014-11-05: 1000 ug via INTRAMUSCULAR

## 2014-11-05 NOTE — Progress Notes (Signed)
Patient came in for B12 injection.  Received in Left deltoid.  Patient tolerated well.  

## 2014-11-29 ENCOUNTER — Encounter: Payer: Self-pay | Admitting: Internal Medicine

## 2014-11-29 DIAGNOSIS — M25559 Pain in unspecified hip: Secondary | ICD-10-CM

## 2014-12-04 ENCOUNTER — Ambulatory Visit: Payer: Medicare Other | Admitting: Family Medicine

## 2014-12-09 ENCOUNTER — Ambulatory Visit (INDEPENDENT_AMBULATORY_CARE_PROVIDER_SITE_OTHER): Payer: Medicare Other

## 2014-12-09 DIAGNOSIS — E538 Deficiency of other specified B group vitamins: Secondary | ICD-10-CM | POA: Diagnosis not present

## 2014-12-09 MED ORDER — CYANOCOBALAMIN 1000 MCG/ML IJ SOLN
1000.0000 ug | Freq: Once | INTRAMUSCULAR | Status: AC
  Administered 2014-12-09: 1000 ug via INTRAMUSCULAR

## 2014-12-09 NOTE — Progress Notes (Signed)
Pt was given a B12 shot today in her right deltoid. Pt tolerated well.

## 2015-01-13 ENCOUNTER — Ambulatory Visit (INDEPENDENT_AMBULATORY_CARE_PROVIDER_SITE_OTHER): Payer: Medicare Other | Admitting: *Deleted

## 2015-01-13 DIAGNOSIS — E538 Deficiency of other specified B group vitamins: Secondary | ICD-10-CM | POA: Diagnosis not present

## 2015-01-13 MED ORDER — CYANOCOBALAMIN 1000 MCG/ML IJ SOLN
1000.0000 ug | Freq: Once | INTRAMUSCULAR | Status: AC
  Administered 2015-01-13: 1000 ug via INTRAMUSCULAR

## 2015-01-13 NOTE — Progress Notes (Signed)
Patient tolerated B 12 injection to left deltoid very well.

## 2015-02-17 ENCOUNTER — Ambulatory Visit (INDEPENDENT_AMBULATORY_CARE_PROVIDER_SITE_OTHER): Payer: Medicare Other | Admitting: *Deleted

## 2015-02-17 DIAGNOSIS — E538 Deficiency of other specified B group vitamins: Secondary | ICD-10-CM | POA: Diagnosis not present

## 2015-02-17 MED ORDER — CYANOCOBALAMIN 1000 MCG/ML IJ SOLN
1000.0000 ug | Freq: Once | INTRAMUSCULAR | Status: AC
  Administered 2015-02-17: 1000 ug via INTRAMUSCULAR

## 2015-02-17 NOTE — Progress Notes (Signed)
Patient presented for B 12 injection to right deltoid and tolerated well.

## 2015-03-18 ENCOUNTER — Ambulatory Visit (INDEPENDENT_AMBULATORY_CARE_PROVIDER_SITE_OTHER): Payer: Medicare Other | Admitting: Internal Medicine

## 2015-03-18 ENCOUNTER — Encounter: Payer: Self-pay | Admitting: Internal Medicine

## 2015-03-18 VITALS — BP 146/79 | HR 87 | Temp 98.3°F | Ht 65.75 in | Wt 210.0 lb

## 2015-03-18 DIAGNOSIS — H60399 Other infective otitis externa, unspecified ear: Secondary | ICD-10-CM | POA: Insufficient documentation

## 2015-03-18 DIAGNOSIS — H60393 Other infective otitis externa, bilateral: Secondary | ICD-10-CM

## 2015-03-18 MED ORDER — CIPROFLOXACIN-DEXAMETHASONE 0.3-0.1 % OT SUSP: 4.0000 [drp] | Freq: Two times a day (BID) | OTIC | Status: DC

## 2015-03-18 NOTE — Patient Instructions (Signed)
Start Cipro-Dex 4 drops twice daily in both ears.  Leave hearing aids out as much as possible.  Follow up if symptoms are not improving.

## 2015-03-18 NOTE — Progress Notes (Signed)
Subjective:    Patient ID: [name], female    DOB: 1945-07-03, 70 y.o.   MRN: [id number]  HPI  70YO female presents for acute visit.  Having bilateral external ear pain after wearing hearing aids continuously for last 5 days, caring for her mother. Ears are tender. It has been difficult to get hearing aids in place. No fever, chills, congestion  Wt Readings from Last 3 Encounters:  03/18/15 210 lb (95.255 kg)  11/04/14 203 lb (92.08 kg)  10/06/14 203 lb 6 oz (92.25 kg)   BP Readings from Last 3 Encounters:  03/18/15 146/79  11/04/14 132/84  10/06/14 131/82    Past Medical History  Diagnosis Date  . Endometrial cancer [facility])     s/p hysterectomy   Family History  Problem Relation Age of Onset  . Cancer Father     colon   Past Surgical History  Procedure Laterality Date  . Abdominal hysterectomy  2010    Endometrial Ca  . Cholecystectomy  2010   Social History   Social History  . Marital Status: Single    Spouse Name: N/A  . Number of Children: 1  . Years of Education: N/A   Occupational History  . Retired    Social History Main Topics  . Smoking status: Never Smoker   . Smokeless tobacco: Never Used  . Alcohol Use: Yes     Comment: Rarely  . Drug Use: No  . Sexual Activity: Not Asked   Other Topics Concern  . None   Social History Narrative   Lives with mother in [place]. 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 Exercises regularly and follows a healthy diet.             Review of Systems  Constitutional: Negative for fever, chills and unexpected weight change.  HENT: Positive for ear discharge, ear pain and hearing loss. Negative for congestion, facial swelling, mouth sores, nosebleeds, postnasal drip, rhinorrhea, sinus pressure, sneezing, sore throat, tinnitus, trouble swallowing and voice change.   Eyes: Negative for pain, discharge, redness and visual disturbance.  Respiratory: Negative for cough, chest tightness, shortness of breath,  wheezing and stridor.   Cardiovascular: Negative for chest pain, palpitations and leg swelling.  Musculoskeletal: Negative for myalgias, arthralgias, neck pain and neck stiffness.  Skin: Negative for color change and rash.  Neurological: Negative for dizziness, weakness, light-headedness and headaches.  Hematological: Negative for adenopathy.       Objective:    BP 146/79 mmHg  Pulse 87  Temp(Src) 98.3 F (36.8 C) (Oral)  Ht 5' 5.75" (1.67 m)  Wt 210 lb (95.255 kg)  BMI 34.16 kg/m2  SpO2 97% Physical Exam  Constitutional: She is oriented to person, place, and time. She appears well-developed and well-nourished. No distress.  HENT:  Head: Normocephalic and atraumatic.  Right Ear: There is drainage, swelling and tenderness. Tympanic membrane is not erythematous and not bulging. No middle ear effusion.  Left Ear: There is drainage, swelling and tenderness. Tympanic membrane is not erythematous and not bulging.  No middle ear effusion.  Nose: Nose normal.  Mouth/Throat: Oropharynx is clear and moist. No oropharyngeal exudate.  Bilateral ear canals are erythematous, indurated with some clear drainage. TMs normal.  Eyes: Conjunctivae are normal. Pupils are equal, round, and reactive to light. Right eye exhibits no discharge. Left eye exhibits no discharge. No scleral icterus.  Neck: Normal range of motion. Neck supple. No tracheal deviation present. No thyromegaly present.  Cardiovascular: Normal rate, regular rhythm, normal  heart sounds and intact distal pulses.  Exam reveals no gallop and no friction rub.   No murmur heard. Pulmonary/Chest: Effort normal and breath sounds normal. No respiratory distress. She has no wheezes. She has no rales. She exhibits no tenderness.  Musculoskeletal: Normal range of motion. She exhibits no edema or tenderness.  Lymphadenopathy:    She has no cervical adenopathy.  Neurological: She is alert and oriented to person, place, and time. No cranial nerve  deficit. She exhibits normal muscle tone. Coordination normal.  Skin: Skin is warm and dry. No rash noted. She is not diaphoretic. No erythema. No pallor.  Psychiatric: She has a normal mood and affect. Her behavior is normal. Judgment and thought content normal.          Assessment & Plan:   Problem List Items Addressed This Visit      Unprioritized   Otitis, externa, infective - Primary    Otitis externa bilateral. Start Ciprodex. Leave hearing aids out tonight. Follow up later this week if symptoms are not improving.          Return if symptoms worsen or fail to improve.

## 2015-03-18 NOTE — Assessment & Plan Note (Signed)
Otitis externa bilateral. Start Ciprodex. Leave hearing aids out tonight. Follow up later this week if symptoms are not improving.

## 2015-03-18 NOTE — Progress Notes (Signed)
Pre visit review using our clinic review tool, if applicable. No additional management support is needed unless otherwise documented below in the visit note. 

## 2015-03-20 ENCOUNTER — Encounter: Payer: Self-pay | Admitting: Internal Medicine

## 2015-03-24 ENCOUNTER — Ambulatory Visit (INDEPENDENT_AMBULATORY_CARE_PROVIDER_SITE_OTHER): Payer: Medicare Other

## 2015-03-24 DIAGNOSIS — E538 Deficiency of other specified B group vitamins: Secondary | ICD-10-CM

## 2015-03-24 MED ORDER — CYANOCOBALAMIN 1000 MCG/ML IJ SOLN
1000.0000 ug | Freq: Once | INTRAMUSCULAR | Status: AC
  Administered 2015-03-24: 1000 ug via INTRAMUSCULAR

## 2015-03-24 NOTE — Progress Notes (Signed)
Patient came in for B12 injection.  Received in Left deltoid.  Patient tolerated well.  

## 2015-04-21 ENCOUNTER — Ambulatory Visit: Payer: Medicare Other

## 2015-05-04 ENCOUNTER — Encounter: Payer: Self-pay | Admitting: Internal Medicine

## 2015-05-05 ENCOUNTER — Encounter: Payer: Self-pay | Admitting: Internal Medicine

## 2015-05-21 ENCOUNTER — Ambulatory Visit: Payer: Medicare Other

## 2015-06-03 ENCOUNTER — Ambulatory Visit (INDEPENDENT_AMBULATORY_CARE_PROVIDER_SITE_OTHER): Payer: Medicare Other

## 2015-06-03 DIAGNOSIS — E538 Deficiency of other specified B group vitamins: Secondary | ICD-10-CM

## 2015-06-03 MED ORDER — CYANOCOBALAMIN 1000 MCG/ML IJ SOLN
1000.0000 ug | Freq: Once | INTRAMUSCULAR | Status: AC
  Administered 2015-06-03: 1000 ug via INTRAMUSCULAR

## 2015-06-03 NOTE — Progress Notes (Signed)
Patient came in for B12 injection.  Received in right delotid.  Patient tolerated well.

## 2015-06-24 ENCOUNTER — Encounter: Payer: Self-pay | Admitting: Internal Medicine

## 2015-06-25 ENCOUNTER — Encounter: Payer: Self-pay | Admitting: Internal Medicine

## 2015-06-25 DIAGNOSIS — M25559 Pain in unspecified hip: Secondary | ICD-10-CM

## 2015-07-07 ENCOUNTER — Ambulatory Visit (INDEPENDENT_AMBULATORY_CARE_PROVIDER_SITE_OTHER): Payer: Medicare Other

## 2015-07-07 DIAGNOSIS — E538 Deficiency of other specified B group vitamins: Secondary | ICD-10-CM | POA: Diagnosis not present

## 2015-07-07 MED ORDER — CYANOCOBALAMIN 1000 MCG/ML IJ SOLN
1000.0000 ug | Freq: Once | INTRAMUSCULAR | Status: AC
  Administered 2015-07-07: 1000 ug via INTRAMUSCULAR

## 2015-07-07 NOTE — Progress Notes (Signed)
Patient presented for B12 injection, per Dr. Thomes Dinning order.  Administered L deltoid, tolerated well.  Next monthly appointment scheduled.

## 2015-07-14 ENCOUNTER — Ambulatory Visit: Payer: Medicare Other | Admitting: Family Medicine

## 2015-08-11 ENCOUNTER — Ambulatory Visit (INDEPENDENT_AMBULATORY_CARE_PROVIDER_SITE_OTHER): Payer: Medicare Other

## 2015-08-11 DIAGNOSIS — E538 Deficiency of other specified B group vitamins: Secondary | ICD-10-CM

## 2015-08-11 MED ORDER — CYANOCOBALAMIN 1000 MCG/ML IJ SOLN
1000.0000 ug | Freq: Once | INTRAMUSCULAR | Status: AC
  Administered 2015-08-11: 1000 ug via INTRAMUSCULAR

## 2015-08-11 NOTE — Progress Notes (Signed)
Patient came in for B12 injection.  Received in Right deltoid.  Patient tolerated well.  

## 2015-09-15 ENCOUNTER — Ambulatory Visit (INDEPENDENT_AMBULATORY_CARE_PROVIDER_SITE_OTHER): Payer: Medicare Other | Admitting: Surgical

## 2015-09-15 DIAGNOSIS — E538 Deficiency of other specified B group vitamins: Secondary | ICD-10-CM | POA: Diagnosis not present

## 2015-09-15 MED ORDER — CYANOCOBALAMIN 1000 MCG/ML IJ SOLN
1000.0000 ug | Freq: Once | INTRAMUSCULAR | Status: AC
  Administered 2015-09-15: 1000 ug via INTRAMUSCULAR

## 2015-09-15 NOTE — Progress Notes (Signed)
Patient came in today for B 12 injection. Given in right deltoid. Patient tolerated well.

## 2015-10-21 ENCOUNTER — Ambulatory Visit: Payer: Medicare Other

## 2015-10-27 ENCOUNTER — Ambulatory Visit (INDEPENDENT_AMBULATORY_CARE_PROVIDER_SITE_OTHER): Payer: Medicare Other

## 2015-10-27 DIAGNOSIS — E538 Deficiency of other specified B group vitamins: Secondary | ICD-10-CM

## 2015-10-27 MED ORDER — CYANOCOBALAMIN 1000 MCG/ML IJ SOLN
1000.0000 ug | Freq: Once | INTRAMUSCULAR | Status: AC
  Administered 2015-10-27: 1000 ug via INTRAMUSCULAR

## 2015-10-27 NOTE — Progress Notes (Signed)
Pt was in today receiving a B12 in the left deltoid. Pt tolerated well.

## 2015-10-27 NOTE — Progress Notes (Signed)
I have reviewed the above note and agree.  Khali Perella, M.D.  

## 2015-11-17 ENCOUNTER — Ambulatory Visit (INDEPENDENT_AMBULATORY_CARE_PROVIDER_SITE_OTHER): Payer: Medicare Other | Admitting: Internal Medicine

## 2015-11-17 ENCOUNTER — Encounter: Payer: Self-pay | Admitting: Internal Medicine

## 2015-11-17 VITALS — BP 128/82 | HR 72 | Temp 98.2°F | Ht 66.0 in | Wt 212.0 lb

## 2015-11-17 DIAGNOSIS — Z7189 Other specified counseling: Secondary | ICD-10-CM | POA: Insufficient documentation

## 2015-11-17 DIAGNOSIS — Z636 Dependent relative needing care at home: Secondary | ICD-10-CM | POA: Diagnosis not present

## 2015-11-17 DIAGNOSIS — Z0000 Encounter for general adult medical examination without abnormal findings: Secondary | ICD-10-CM | POA: Diagnosis not present

## 2015-11-17 DIAGNOSIS — H8110 Benign paroxysmal vertigo, unspecified ear: Secondary | ICD-10-CM | POA: Diagnosis not present

## 2015-11-17 DIAGNOSIS — Z1211 Encounter for screening for malignant neoplasm of colon: Secondary | ICD-10-CM

## 2015-11-17 DIAGNOSIS — E785 Hyperlipidemia, unspecified: Secondary | ICD-10-CM

## 2015-11-17 DIAGNOSIS — Z23 Encounter for immunization: Secondary | ICD-10-CM | POA: Diagnosis not present

## 2015-11-17 LAB — COMPREHENSIVE METABOLIC PANEL
ALT: 20 U/L (ref 0–35)
AST: 24 U/L (ref 0–37)
Albumin: 3.9 g/dL (ref 3.5–5.2)
Alkaline Phosphatase: 72 U/L (ref 39–117)
BILIRUBIN TOTAL: 0.4 mg/dL (ref 0.2–1.2)
BUN: 17 mg/dL (ref 6–23)
CALCIUM: 8.9 mg/dL (ref 8.4–10.5)
CHLORIDE: 105 meq/L (ref 96–112)
CO2: 31 meq/L (ref 19–32)
Creatinine, Ser: 0.8 mg/dL (ref 0.40–1.20)
GFR: 75.55 mL/min (ref >60.00)
Glucose, Bld: 92 mg/dL (ref 70–99)
Potassium: 4.9 mEq/L (ref 3.5–5.1)
SODIUM: 139 meq/L (ref 135–145)
Total Protein: 7 g/dL (ref 6.0–8.3)

## 2015-11-17 LAB — CBC WITH DIFFERENTIAL/PLATELET
BASOS ABS: 0 10*3/uL (ref 0.0–0.1)
BASOS PCT: 0.3 % (ref 0.0–3.0)
EOS ABS: 0.3 10*3/uL (ref 0.0–0.7)
Eosinophils Relative: 3.4 % (ref 0.0–5.0)
HCT: 43.5 % (ref 36.0–46.0)
Hemoglobin: 15.1 g/dL — ABNORMAL HIGH (ref 12.0–15.0)
LYMPHS ABS: 2 10*3/uL (ref 0.7–4.0)
LYMPHS PCT: 27.3 % (ref 12.0–46.0)
MCHC: 34.7 g/dL (ref 30.0–36.0)
MCV: 88.8 fl (ref 78.0–100.0)
MONO ABS: 0.6 10*3/uL (ref 0.1–1.0)
Monocytes Relative: 7.8 % (ref 3.0–12.0)
NEUTROS ABS: 4.5 10*3/uL (ref 1.4–7.7)
Neutrophils Relative %: 61.2 % (ref 43.0–77.0)
PLATELETS: 259 10*3/uL (ref 150.0–400.0)
RBC: 4.89 Mil/uL (ref 3.87–5.11)
RDW: 12.8 % (ref 11.5–15.5)
WBC: 7.4 10*3/uL (ref 4.0–10.5)

## 2015-11-17 LAB — LIPID PANEL
Cholesterol: 255 mg/dL — ABNORMAL HIGH (ref 0–200)
HDL: 49.3 mg/dL (ref >39.00)
NONHDL: 206.18
Total CHOL/HDL Ratio: 5
Triglycerides: 265 mg/dL — ABNORMAL HIGH (ref 0.0–149.0)
VLDL: 53 mg/dL — ABNORMAL HIGH (ref 0.0–40.0)

## 2015-11-17 LAB — LDL CHOLESTEROL, DIRECT: LDL DIRECT: 147 mg/dL

## 2015-11-17 NOTE — Assessment & Plan Note (Signed)
She will work on [employer] prefers no med

## 2015-11-17 NOTE — Progress Notes (Signed)
Pre visit review using our clinic review tool, if applicable. No additional management support is needed unless otherwise documented below in the visit note. 

## 2015-11-17 NOTE — Assessment & Plan Note (Signed)
I have personally reviewed the Medicare Annual Wellness questionnaire and have noted 1. The patient's medical and social history 2. Their use of alcohol, tobacco or illicit drugs 3. Their current medications and supplements 4. The patient's functional ability including ADL's, fall risks, home safety risks and hearing or visual             impairment. 5. Diet and physical activities 6. Evidence for depression or mood disorders  The patients weight, height, BMI and visual acuity have been recorded in the chart I have made referrals, counseling and provided education to the patient based review of the above and I have provided the pt with a written personalized care plan for preventive services.  I have provided you with a copy of your personalized plan for preventive services. Please take the time to review along with your updated medication list.  Due for mammogram Discussed fitness Doesn't want zostavax right now Will do FIT-- 5 years since colon with +FH (in dad) Flu vaccine today No pap---hyster

## 2015-11-17 NOTE — Assessment & Plan Note (Signed)
Discussed that she needs to hire more help, etc

## 2015-11-17 NOTE — Addendum Note (Signed)
Addended by: Pilar Grammes on: 11/17/2015 02:28 PM   Modules accepted: Orders

## 2015-11-17 NOTE — Patient Instructions (Addendum)
Please set up your screening mammogram.  DASH Eating Plan DASH stands for "Dietary Approaches to Stop Hypertension." The DASH eating plan is a healthy eating plan that has been shown to reduce high blood pressure (hypertension). Additional health benefits may include reducing the risk of type 2 diabetes mellitus, heart disease, and stroke. The DASH eating plan may also help with weight loss. WHAT DO I NEED TO KNOW ABOUT THE DASH EATING PLAN? For the DASH eating plan, you will follow these general guidelines:  Choose foods with a percent daily value for sodium of less than 5% (as listed on the food label).  Use salt-free seasonings or herbs instead of table salt or sea salt.  Check with your health care provider or pharmacist before using salt substitutes.  Eat lower-sodium products, often labeled as "lower sodium" or "no salt added."  Eat fresh foods.  Eat more vegetables, fruits, and low-fat dairy products.  Choose whole grains. Look for the word "whole" as the first word in the ingredient list.  Choose fish and skinless chicken or [place] more often than red meat. Limit fish, poultry, and meat to 6 oz (170 g) each day.  Limit sweets, desserts, sugars, and sugary drinks.  Choose heart-healthy fats.  Limit cheese to 1 oz (28 g) per day.  Eat more home-cooked food and less restaurant, buffet, and fast food.  Limit fried foods.  Cook foods using methods other than frying.  Limit canned vegetables. If you do use them, rinse them well to decrease the sodium.  When eating at a restaurant, ask that your food be prepared with less salt, or no salt if possible. WHAT FOODS CAN I EAT? Seek help from a dietitian for individual calorie needs. Grains Whole grain or whole wheat bread. Brown rice. Whole grain or whole wheat pasta. Quinoa, bulgur, and whole grain cereals. Low-sodium cereals. Corn or whole wheat flour tortillas. Whole grain cornbread. Whole grain crackers. Low-sodium  crackers. Vegetables Fresh or frozen vegetables (raw, steamed, roasted, or grilled). Low-sodium or reduced-sodium tomato and vegetable juices. Low-sodium or reduced-sodium tomato sauce and paste. Low-sodium or reduced-sodium canned vegetables.  Fruits All fresh, canned (in natural juice), or frozen fruits. Meat and Other Protein Products Ground beef (85% or leaner), grass-fed beef, or beef trimmed of fat. Skinless chicken or [place]. Ground chicken or [place]. Pork trimmed of fat. All fish and seafood. Eggs. Dried beans, peas, or lentils. Unsalted nuts and seeds. Unsalted canned beans. Dairy Low-fat dairy products, such as skim or 1% milk, 2% or reduced-fat cheeses, low-fat ricotta or cottage cheese, or plain low-fat yogurt. Low-sodium or reduced-sodium cheeses. Fats and Oils Tub margarines without trans fats. Light or reduced-fat mayonnaise and salad dressings (reduced sodium). Avocado. Safflower, olive, or canola oils. Natural peanut or almond butter. Other Unsalted popcorn and pretzels. The items listed above may not be a complete list of recommended foods or beverages. Contact your dietitian for more options. WHAT FOODS ARE NOT RECOMMENDED? Grains White bread. White pasta. White rice. Refined cornbread. Bagels and croissants. Crackers that contain trans fat. Vegetables Creamed or fried vegetables. Vegetables in a cheese sauce. Regular canned vegetables. Regular canned tomato sauce and paste. Regular tomato and vegetable juices. Fruits Dried fruits. Canned fruit in light or heavy syrup. Fruit juice. Meat and Other Protein Products Fatty cuts of meat. Ribs, chicken wings, bacon, sausage, bologna, salami, chitterlings, fatback, hot dogs, bratwurst, and packaged luncheon meats. Salted nuts and seeds. Canned beans with salt. Dairy Whole or 2% milk, cream, half-and-half, and  cream cheese. Whole-fat or sweetened yogurt. Full-fat cheeses or blue cheese. Nondairy creamers and whipped toppings.  Processed cheese, cheese spreads, or cheese curds. Condiments Onion and garlic salt, seasoned salt, table salt, and sea salt. Canned and packaged gravies. Worcestershire sauce. Tartar sauce. Barbecue sauce. Teriyaki sauce. Soy sauce, including reduced sodium. Steak sauce. Fish sauce. Oyster sauce. Cocktail sauce. Horseradish. Ketchup and mustard. Meat flavorings and tenderizers. Bouillon cubes. Hot sauce. Tabasco sauce. Marinades. Taco seasonings. Relishes. Fats and Oils Butter, stick margarine, lard, shortening, ghee, and bacon fat. Coconut, palm kernel, or palm oils. Regular salad dressings. Other Pickles and olives. Salted popcorn and pretzels. The items listed above may not be a complete list of foods and beverages to avoid. Contact your dietitian for more information. WHERE CAN I FIND MORE INFORMATION? National Heart, Lung, and Blood Institute: travelstabloid.com   This information is not intended to replace advice given to you by your health care provider. Make sure you discuss any questions you have with your health care provider.   Document Released: 01/20/2011 Document Revised: 02/21/2014 Document Reviewed: 12/05/2012 Elsevier Interactive Patient Education [employer].

## 2015-11-17 NOTE — Progress Notes (Signed)
Subjective:    Patient ID: [name], female    DOB: 1945-04-11, 70 y.o.   MRN: [id number]  HPI Here to establish care and for Medicare wellness visit Reviewed form and advanced directives Reviewed other doctors-- Dr Morrison Old, [place] Both is dentist. Dr Gaspar Bidding is ophtho Rare alcohol No tobacco No falls Rare mood issues--but no anhedonia Tries to exercise but not often due to caregiver responsibilities Vision is okay Has hearing aides since age 35. Audiology at [facility] with instrumental ADLs No memory issues  Ongoing stress with caring for mother Staying with her--I [facility] her [facility] her up at night Aide only when she is away  No health concerns otherwise  Current Outpatient Prescriptions on File Prior to Visit  Medication Sig Dispense Refill  . brimonidine-timolol (COMBIGAN) 0.2-0.5 % ophthalmic solution Place 1 drop into both eyes every 12 (twelve) hours.    . Cholecalciferol (VITAMIN D-3 PO) Take by mouth.    . cyanocobalamin (,VITAMIN B-12,) 1000 MCG/ML injection Inject 1,000 mcg into the muscle once.    . Fish Oil OIL by Does not apply route.     No current facility-administered medications on file prior to visit.     No Known Allergies  Past Medical History:  Diagnosis Date  . BPPV (benign paroxysmal positional vertigo)   . Endometrial cancer ([place]) 2010   s/p hysterectomy  . Glaucoma    Dr Gaspar Bidding [facility])    Past Surgical History:  Procedure Laterality Date  . ABDOMINAL HYSTERECTOMY  2010   Endometrial Ca  . CHOLECYSTECTOMY  2010  . TONSILLECTOMY      Family History  Problem Relation Age of Onset  . Cancer Father     colon  . Heart disease Neg Hx   . Diabetes Neg Hx     Social History   Social History  . Marital status: Single    Spouse name: N/A  . Number of children: 1  . Years of education: N/A   Occupational History  . Schoolteacher     Retired   Social History Main Topics  . Smoking status:  Never Smoker  . Smokeless tobacco: Never Used  . Alcohol use Yes     Comment: Rarely  . Drug use: No  . Sexual activity: Not on file   Other Topics Concern  . Not on file   Social History Narrative   Lives with mother in [place]. 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      Has living will   No health care POA   Would accept resuscitation   Hasn't considered tube feeds         Review of Systems  Appetite is okay Weight is up a bit Sleeps okay if not up with mom Wears seat belt Teeth are good--keeps up with dentist No chest pain or SOB No dizziness or syncope No edema Bowels are okay. No blood in stool Voids okay Some ongoing issues with trochanteric bursitis--will stretch and ice prn No skin issues     Objective:   Physical Exam  Constitutional: She is oriented to person, place, and time. She appears well-developed and well-nourished. No distress.  HENT:  Mouth/Throat: Oropharynx is clear and moist. No oropharyngeal exudate.  Neck: Normal range of motion. Neck supple. No thyromegaly present.  Cardiovascular: Normal rate, regular rhythm, normal heart sounds and intact distal pulses.  Exam reveals no gallop.   No murmur heard. Pulmonary/Chest: Effort normal and breath sounds normal. No respiratory  distress. She has no wheezes. She has no rales.  Abdominal: Soft. There is no tenderness.  Musculoskeletal: She exhibits no edema or tenderness.  Lymphadenopathy:    She has no cervical adenopathy.  Neurological: She is alert and oriented to person, place, and time.  President--- "Dwaine Deter, Bush" [phone] [id number] Recall 3/3  Skin: No rash noted. No erythema.  Psychiatric: She has a normal mood and affect. Her behavior is normal.          Assessment & Plan:

## 2015-11-17 NOTE — Assessment & Plan Note (Signed)
See social history 

## 2015-11-17 NOTE — Assessment & Plan Note (Signed)
Uses meclizine prn 

## 2015-11-25 ENCOUNTER — Ambulatory Visit: Payer: Medicare Other

## 2015-11-30 ENCOUNTER — Other Ambulatory Visit (INDEPENDENT_AMBULATORY_CARE_PROVIDER_SITE_OTHER): Payer: Medicare Other

## 2015-11-30 DIAGNOSIS — Z1211 Encounter for screening for malignant neoplasm of colon: Secondary | ICD-10-CM

## 2015-11-30 LAB — FECAL OCCULT BLOOD, IMMUNOCHEMICAL: Fecal Occult Bld: NEGATIVE

## 2015-12-01 ENCOUNTER — Ambulatory Visit: Payer: Medicare Other

## 2015-12-09 ENCOUNTER — Ambulatory Visit (INDEPENDENT_AMBULATORY_CARE_PROVIDER_SITE_OTHER): Payer: Medicare Other

## 2015-12-09 ENCOUNTER — Encounter: Payer: Self-pay | Admitting: Internal Medicine

## 2015-12-09 DIAGNOSIS — E538 Deficiency of other specified B group vitamins: Secondary | ICD-10-CM | POA: Diagnosis not present

## 2015-12-09 MED ORDER — CYANOCOBALAMIN 1000 MCG/ML IJ SOLN
1000.0000 ug | Freq: Once | INTRAMUSCULAR | Status: AC
  Administered 2015-12-09: 1000 ug via INTRAMUSCULAR

## 2015-12-21 ENCOUNTER — Other Ambulatory Visit: Payer: Self-pay | Admitting: Internal Medicine

## 2015-12-21 DIAGNOSIS — Z1231 Encounter for screening mammogram for malignant neoplasm of breast: Secondary | ICD-10-CM

## 2016-01-04 ENCOUNTER — Ambulatory Visit
Admission: RE | Admit: 2016-01-04 | Discharge: 2016-01-04 | Disposition: A | Discharge status: Home / Self Care | Payer: Medicare Other | Source: Ambulatory Visit | Attending: Internal Medicine | Admitting: Internal Medicine

## 2016-01-04 DIAGNOSIS — Z1231 Encounter for screening mammogram for malignant neoplasm of breast: Secondary | ICD-10-CM | POA: Diagnosis present

## 2016-11-21 ENCOUNTER — Encounter: Payer: Medicare Other | Admitting: Internal Medicine

## 2017-04-18 ENCOUNTER — Encounter: Payer: Self-pay | Admitting: Family Medicine

## 2017-04-18 ENCOUNTER — Ambulatory Visit: Payer: Medicare Other | Admitting: Family Medicine

## 2017-04-18 VITALS — BP 124/76 | HR 63 | Temp 97.7°F | Wt 229.8 lb

## 2017-04-18 DIAGNOSIS — M25562 Pain in left knee: Secondary | ICD-10-CM

## 2017-04-18 NOTE — Patient Instructions (Signed)
I think you have iliotibial band syndrome, less likely meniscal irritation.  Treat with ibuprofen 400mg  twice daily with meals for 5 days then as needed. May also use ice or heating pad to knee (not directly on skin but rather covered in towel, no more than 10-26min at a time)  Let us know if not improving to consider physical therapy course.

## 2017-04-18 NOTE — Assessment & Plan Note (Signed)
Anticipate ITBS, supportive care reviewed. Update if not improving for PT course. Pt agrees with plan.

## 2017-04-18 NOTE — Progress Notes (Signed)
   BP 124/76 (BP Location: Left Arm, Patient Position: Sitting, Cuff Size: Normal)   Pulse 63   Temp 97.7 F (36.5 C) (Oral)   Wt 229 lb 12 oz (104.2 kg)   SpO2 94%   BMI 37.08 kg/m    CC: knee pain Subjective:    Patient ID: [name], female    DOB: May 13, 1945, 72 y.o.   MRN: [id number]  HPI: [name] is a 72 y.o. female presenting on 04/18/2017 for Knee Injury (Pt believes she pulled a muscle or something in her knee while lifting her mother who receiving hospice care at home. )   L knee pain started Friday evening. Points to lateral knee. No redness, swelling, warmth. Feels something rubbing. More noticeable - locking, buckling - with walking or pivot.   Thinks it may have started while lifting mother (caregiver, mom in hospice at home, using bedside commode).   Started using cane which helps. Has tried tylenol, ibuprofen, ice packs, and biofreeze Denies prior knee injury.   Relevant past medical, surgical, family and social history reviewed and updated as indicated. Interim medical history since our last visit reviewed. Allergies and medications reviewed and updated. Outpatient Medications Prior to Visit  Medication Sig Dispense Refill  . brimonidine-timolol (COMBIGAN) 0.2-0.5 % ophthalmic solution Place 1 drop into both eyes every 12 (twelve) hours.    . Cholecalciferol (VITAMIN D-3 PO) Take by mouth.    . cyanocobalamin (,VITAMIN B-12,) 1000 MCG/ML injection Inject 1,000 mcg into the muscle once.    . Fish Oil OIL by Does not apply route.     No facility-administered medications prior to visit.      Per HPI unless specifically indicated in ROS section below Review of Systems     Objective:    BP 124/76 (BP Location: Left Arm, Patient Position: Sitting, Cuff Size: Normal)   Pulse 63   Temp 97.7 F (36.5 C) (Oral)   Wt 229 lb 12 oz (104.2 kg)   SpO2 94%   BMI 37.08 kg/m   Wt Readings from Last 3 Encounters:  04/18/17 229 lb 12 oz (104.2 kg)  11/17/15  212 lb (96.2 kg)  03/18/15 210 lb (95.3 kg)    Physical Exam  Constitutional: She appears well-developed and well-nourished. No distress.  Musculoskeletal: She exhibits no edema.  R knee WNL L Knee exam: No deformity on inspection. No pain with palpation of knee landmarks. No effusion/swelling noted. FROM in flex/extension without crepitus. No popliteal fullness. Neg drawer test. Neg mcmurray test. No pain with valgus/varus stress. No PFgrind. No abnormal patellar mobility.  Tender to palpation lateral knee joint  Skin: Skin is warm and dry. No rash noted. No erythema.  Nursing note and vitals reviewed.     Assessment & Plan:   Problem List Items Addressed This Visit    Left lateral knee pain - Primary    Anticipate ITBS, supportive care reviewed. Update if not improving for PT course. Pt agrees with plan.           No orders of the defined types were placed in this encounter.  No orders of the defined types were placed in this encounter.   Follow up plan: Return if symptoms worsen or fail to improve.  Ria Bush, MD

## 2017-04-21 ENCOUNTER — Encounter: Payer: Self-pay | Admitting: Family Medicine

## 2017-04-21 DIAGNOSIS — M25562 Pain in left knee: Secondary | ICD-10-CM

## 2017-07-21 ENCOUNTER — Ambulatory Visit: Payer: Medicare Other | Admitting: Internal Medicine

## 2017-07-21 ENCOUNTER — Encounter: Payer: Self-pay | Admitting: Internal Medicine

## 2017-07-21 VITALS — BP 122/80 | HR 72 | Temp 98.3°F | Ht 66.0 in | Wt 228.0 lb

## 2017-07-21 DIAGNOSIS — G629 Polyneuropathy, unspecified: Secondary | ICD-10-CM | POA: Insufficient documentation

## 2017-07-21 DIAGNOSIS — E538 Deficiency of other specified B group vitamins: Secondary | ICD-10-CM | POA: Diagnosis not present

## 2017-07-21 DIAGNOSIS — M79605 Pain in left leg: Secondary | ICD-10-CM

## 2017-07-21 DIAGNOSIS — M79604 Pain in right leg: Secondary | ICD-10-CM | POA: Diagnosis not present

## 2017-07-21 LAB — COMPREHENSIVE METABOLIC PANEL
ALBUMIN: 4.3 g/dL (ref 3.5–5.2)
ALT: 16 U/L (ref 0–35)
AST: 23 U/L (ref 0–37)
Alkaline Phosphatase: 76 U/L (ref 39–117)
BUN: 17 mg/dL (ref 6–23)
CHLORIDE: 104 meq/L (ref 96–112)
CO2: 28 mEq/L (ref 19–32)
Calcium: 9.5 mg/dL (ref 8.4–10.5)
Creatinine, Ser: 0.86 mg/dL (ref 0.40–1.20)
GFR: 69.16 mL/min (ref >60.00)
Glucose, Bld: 91 mg/dL (ref 70–99)
Potassium: 4.4 mEq/L (ref 3.5–5.1)
SODIUM: 140 meq/L (ref 135–145)
Total Bilirubin: 0.5 mg/dL (ref 0.2–1.2)
Total Protein: 7.3 g/dL (ref 6.0–8.3)

## 2017-07-21 LAB — CBC
HEMATOCRIT: 44.4 % (ref 36.0–46.0)
HEMOGLOBIN: 15.1 g/dL — AB (ref 12.0–15.0)
MCHC: 34 g/dL (ref 30.0–36.0)
MCV: 90.3 fl (ref 78.0–100.0)
Platelets: 265 10*3/uL (ref 150.0–400.0)
RBC: 4.92 Mil/uL (ref 3.87–5.11)
RDW: 13.3 % (ref 11.5–15.5)
WBC: 8.1 10*3/uL (ref 4.0–10.5)

## 2017-07-21 LAB — T4, FREE: FREE T4: 0.95 ng/dL (ref 0.60–1.60)

## 2017-07-21 LAB — VITAMIN B12: Vitamin B-12: 1029 pg/mL — ABNORMAL HIGH (ref 211–911)

## 2017-07-21 MED ORDER — CARBIDOPA-LEVODOPA ER 25-100 MG PO TBCR: 1.0000 | EXTENDED_RELEASE_TABLET | Freq: Every day | ORAL | 11 refills | Status: DC

## 2017-07-21 NOTE — Assessment & Plan Note (Signed)
Symptoms sound like RLS Will check labs Try sinemet if not anemic Discussed respite, etc

## 2017-07-21 NOTE — Progress Notes (Signed)
Subjective:    Patient ID: [name], female    DOB: Feb 27, 1945, 72 y.o.   MRN: [id number]  HPI Here due to leg pain  Still taking care of mom 24/7 Was on hospice but just "graduated" Lives in her house She is exhausted--even though she is sleeping all night Does have baby monitor --?bothered by random noises  Legs are achy in day Mostly a problem at night though Feels better when walking and moving around Some full blown cramps at night Started about a month ago Did think it might be RLS  Some stiffness---relates to her sedentary nature No tremor No change in handwriting No changes in gait  Current Outpatient Medications on File Prior to Visit  Medication Sig Dispense Refill  . brimonidine-timolol (COMBIGAN) 0.2-0.5 % ophthalmic solution Place 1 drop into both eyes every 12 (twelve) hours.    . Cholecalciferol (VITAMIN D-3 PO) Take by mouth.    . Fish Oil OIL by Does not apply route.    . vitamin B-12 (CYANOCOBALAMIN) 1000 MCG tablet Take 1,000 mcg by mouth daily.     No current facility-administered medications on file prior to visit.     No Known Allergies  Past Medical History:  Diagnosis Date  . BPPV (benign paroxysmal positional vertigo)   . Endometrial cancer ([place]) 2010   s/p hysterectomy  . Glaucoma    Dr Gaspar Bidding [facility])    Past Surgical History:  Procedure Laterality Date  . ABDOMINAL HYSTERECTOMY  2010   Endometrial Ca  . CHOLECYSTECTOMY  2010  . TONSILLECTOMY      Family History  Problem Relation Age of Onset  . Cancer Father        colon  . Heart disease Neg Hx   . Diabetes Neg Hx   . Breast cancer Neg Hx     Social History   Socioeconomic History  . Marital status: Single    Spouse name: Not on file  . Number of children: 1  . Years of education: Not on file  . Highest education level: Not on file  Occupational History  . 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 Financial resource strain: Not on  file  . Food insecurity:    Worry: Not on file    Inability: Not on file  . Transportation needs:    Medical: Not on file    Non-medical: Not on file  Tobacco Use  . Smoking status: Never Smoker  . Smokeless tobacco: Never Used  Substance and Sexual Activity  . Alcohol use: Yes    Comment: Rarely  . Drug use: No  . Sexual activity: Not on file  Lifestyle  . Physical activity:    Days per week: Not on file    Minutes per session: Not on file  . Stress: Not on file  Relationships  . Social connections:    Talks on phone: Not on file    Gets together: Not on file    Attends religious service: Not on file    Active member of club or organization: Not on file    Attends meetings of clubs or organizations: Not on file    Relationship status: Not on file  . Intimate partner violence:    Fear of current or ex partner: Not on file    Emotionally abused: Not on file    Physically abused: Not on file    Forced sexual activity: Not on file  Other Topics  Concern  . Not on file  Social History Narrative   Lives with mother in [place]. 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      Has living will   No health care POA   Would accept resuscitation   Hasn't considered tube feeds      Review of Systems Did have PT for IT band on left Has aides only if she has to go out----basically bed bound Lifts her to transfer-- 120#!! No leg weakness Still on the B12    Objective:   Physical Exam  Constitutional: She appears well-developed. No distress.  Neck: No thyromegaly present.  Cardiovascular: Normal rate, regular rhythm and normal heart sounds. Exam reveals no gallop.  No murmur heard. Respiratory: Effort normal and breath sounds normal. No respiratory distress. She has no wheezes. She has no rales.  GI: Soft. There is no tenderness.  Musculoskeletal: She exhibits no edema.  Mild decrease in internal rotation of right hip No tremor, bradykinesia or increase tone    Lymphadenopathy:    She has no cervical adenopathy.  Neurological:  Gait is normal No leg tenderness  Skin: No rash noted.  Psychiatric: She has a normal mood and affect. Her behavior is normal.           Assessment & Plan:

## 2017-07-21 NOTE — Assessment & Plan Note (Signed)
Takes sublingual Will check level

## 2017-07-22 ENCOUNTER — Encounter: Payer: Self-pay | Admitting: Internal Medicine

## 2017-08-22 ENCOUNTER — Encounter: Payer: Self-pay | Admitting: Internal Medicine

## 2017-08-22 DIAGNOSIS — G2581 Restless legs syndrome: Secondary | ICD-10-CM

## 2017-10-06 DIAGNOSIS — G2581 Restless legs syndrome: Secondary | ICD-10-CM | POA: Insufficient documentation

## 2017-11-09 DIAGNOSIS — G629 Polyneuropathy, unspecified: Secondary | ICD-10-CM | POA: Insufficient documentation

## 2017-11-13 ENCOUNTER — Ambulatory Visit (INDEPENDENT_AMBULATORY_CARE_PROVIDER_SITE_OTHER): Payer: Medicare Other | Admitting: Internal Medicine

## 2017-11-13 ENCOUNTER — Encounter: Payer: Self-pay | Admitting: Internal Medicine

## 2017-11-13 VITALS — BP 136/84 | HR 61 | Temp 98.4°F | Ht 65.75 in | Wt 228.0 lb

## 2017-11-13 DIAGNOSIS — G629 Polyneuropathy, unspecified: Secondary | ICD-10-CM

## 2017-11-13 DIAGNOSIS — Z1211 Encounter for screening for malignant neoplasm of colon: Secondary | ICD-10-CM

## 2017-11-13 DIAGNOSIS — Z23 Encounter for immunization: Secondary | ICD-10-CM | POA: Diagnosis not present

## 2017-11-13 DIAGNOSIS — Z0000 Encounter for general adult medical examination without abnormal findings: Secondary | ICD-10-CM | POA: Diagnosis not present

## 2017-11-13 DIAGNOSIS — E785 Hyperlipidemia, unspecified: Secondary | ICD-10-CM

## 2017-11-13 DIAGNOSIS — Z636 Dependent relative needing care at home: Secondary | ICD-10-CM

## 2017-11-13 DIAGNOSIS — Z7189 Other specified counseling: Secondary | ICD-10-CM

## 2017-11-13 NOTE — Progress Notes (Signed)
Hearing Screening Comments: Has hearing aids. Wearing them today. Vision Screening Comments: August 2019  

## 2017-11-13 NOTE — Assessment & Plan Note (Signed)
Not interested in other medications at this point

## 2017-11-13 NOTE — Assessment & Plan Note (Signed)
She is not interested in statins

## 2017-11-13 NOTE — Assessment & Plan Note (Signed)
I have personally reviewed the Medicare Annual Wellness questionnaire and have noted 1. The patient's medical and social history 2. Their use of alcohol, tobacco or illicit drugs 3. Their current medications and supplements 4. The patient's functional ability including ADL's, fall risks, home safety risks and hearing or visual             impairment. 5. Diet and physical activities 6. Evidence for depression or mood disorders  The patients weight, height, BMI and visual acuity have been recorded in the chart I have made referrals, counseling and provided education to the patient based review of the above and I have provided the pt with a written personalized care plan for preventive services.  I have provided you with a copy of your personalized plan for preventive services. Please take the time to review along with your updated medication list.  Will update pneumovax and flu vaccines Mammogram due soon FIT Starting with trainer

## 2017-11-13 NOTE — Progress Notes (Signed)
Subjective:    Patient ID: [name], female    DOB: [birth year], 72 y.o.   MRN: [id number]  HPI Here for Medicare wellness visit and follow up of chronic health conditions Reviewed form and advanced directives Reviewed other doctors No alcohol or tobacco Does some exercise---starting with personal trainer Vision is okay Hearing aides---they help No falls No depression or anhedonia Independent with instrumental ADLs Memory is fine  Reviewed neurology evaluation Did go to PT and it has helped some Diagnosed with neuropathy--not RLS Didn't try left over gabapentin Sinemet didn't help Now taking vitamin D--thinks it helps her be more awake  Still with caregiving responsibility for mom in their [place] discharged Some help from [facility] providers and her sister Is sleeping better--that helps  Has some right hip pain Thinks it is arthritis or bursitis---some help with TENS unit Has exercises for this  Current Outpatient Medications on File Prior to Visit  Medication Sig Dispense Refill  . brimonidine-timolol (COMBIGAN) 0.2-0.5 % ophthalmic solution Place 1 drop into both eyes every 12 (twelve) hours.    . Cholecalciferol (VITAMIN D-3 PO) Take by mouth.    . Fish Oil OIL by Does not apply route.    . vitamin B-12 (CYANOCOBALAMIN) 1000 MCG tablet Take 1,000 mcg by mouth daily.     No current facility-administered medications on file prior to visit.     No Known Allergies  Past Medical History:  Diagnosis Date  . BPPV (benign paroxysmal positional vertigo)   . Endometrial cancer ([place]) 2010   s/p hysterectomy  . Glaucoma    Dr Gaspar Bidding [facility])    Past Surgical History:  Procedure Laterality Date  . ABDOMINAL HYSTERECTOMY  2010   Endometrial Ca  . CHOLECYSTECTOMY  2010  . TONSILLECTOMY      Family History  Problem Relation Age of Onset  . Cancer Father        colon  . Heart disease Neg Hx   . Diabetes Neg Hx   . Breast cancer Neg  Hx     Social History   Socioeconomic History  . Marital status: Single    Spouse name: Not on file  . Number of children: 1  . Years of education: Not on file  . Highest education level: Not on file  Occupational History  . 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 Financial resource strain: Not on file  . Food insecurity:    Worry: Not on file    Inability: Not on file  . Transportation needs:    Medical: Not on file    Non-medical: Not on file  Tobacco Use  . Smoking status: Never Smoker  . Smokeless tobacco: Never Used  Substance and Sexual Activity  . Alcohol use: Yes    Comment: Rarely  . Drug use: No  . Sexual activity: Not on file  Lifestyle  . Physical activity:    Days per week: Not on file    Minutes per session: Not on file  . Stress: Not on file  Relationships  . Social connections:    Talks on phone: Not on file    Gets together: Not on file    Attends religious service: Not on file    Active member of club or organization: Not on file    Attends meetings of clubs or organizations: Not on file    Relationship status: Not on file  . Intimate partner violence:  Fear of current or ex partner: Not on file    Emotionally abused: Not on file    Physically abused: Not on file    Forced sexual activity: Not on file  Other Topics Concern  . Not on file  Social History Narrative   Lives with mother in [place]. 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      Has living will   No health care POA-----sister Tressie Stalker   Would accept resuscitation   Hasn't considered tube feeds      Review of Systems Still doesn't sleep well Weight has stayed stable---all about being stuck in the house Appetite is fine---tries to eat healthy No chest pain or palpitations No cough or SOB No dizziness or syncope No heartburn or dysphagia Bowels are fine--no blood No urinary problems. No incontinence Does keep up with Dr Junious Dresser  hyperpigmentation on face    Objective:   Physical Exam  Constitutional: She is oriented to person, place, and time. She appears well-developed. No distress.  HENT:  Mouth/Throat: Oropharynx is clear and moist. No oropharyngeal exudate.  Neck: No thyromegaly present.  Cardiovascular: Normal rate, regular rhythm, normal heart sounds and intact distal pulses. Exam reveals no gallop.  No murmur heard. Respiratory: Effort normal and breath sounds normal. No respiratory distress. She has no wheezes. She has no rales.  GI: Soft. There is no tenderness.  Musculoskeletal: She exhibits no edema or tenderness.  Lymphadenopathy:    She has no cervical adenopathy.  Neurological: She is alert and oriented to person, place, and time.  President--- "Dwaine Deter, Bush" [phone] [id number] Recall 3/3  Skin: No rash noted. No erythema.  Psychiatric: She has a normal mood and affect. Her behavior is normal.           Assessment & Plan:

## 2017-11-13 NOTE — Assessment & Plan Note (Signed)
Is getting more help with her mom's care This has helped Tries to get out more

## 2017-11-13 NOTE — Assessment & Plan Note (Signed)
See social history Urged her to formalize her directives

## 2017-11-13 NOTE — Patient Instructions (Signed)
DASH Eating Plan DASH stands for "Dietary Approaches to Stop Hypertension." The DASH eating plan is a healthy eating plan that has been shown to reduce high blood pressure (hypertension). It may also reduce your risk for type 2 diabetes, heart disease, and stroke. The DASH eating plan may also help with weight loss. What are tips for following this plan? General guidelines  Avoid eating more than 2,300 mg (milligrams) of salt (sodium) a day. If you have hypertension, you may need to reduce your sodium intake to 1,500 mg a day.  Limit alcohol intake to no more than 1 drink a day for nonpregnant women and 2 drinks a day for men. One drink equals 12 oz of beer, 5 oz of wine, or 1 oz of hard liquor.  Work with your health care provider to maintain a healthy body weight or to lose weight. Ask what an ideal weight is for you.  Get at least 30 minutes of exercise that causes your heart to beat faster (aerobic exercise) most days of the week. Activities may include walking, swimming, or biking.  Work with your health care provider or diet and nutrition specialist (dietitian) to adjust your eating plan to your individual calorie needs. Reading food labels  Check food labels for the amount of sodium per serving. Choose foods with less than 5 percent of the Daily Value of sodium. Generally, foods with less than 300 mg of sodium per serving fit into this eating plan.  To find whole grains, look for the word "whole" as the first word in the ingredient list. Shopping  Buy products labeled as "low-sodium" or "no salt added."  Buy fresh foods. Avoid canned foods and premade or frozen meals. Cooking  Avoid adding salt when cooking. Use salt-free seasonings or herbs instead of table salt or sea salt. Check with your health care provider or pharmacist before using salt substitutes.  Do not fry foods. Cook foods using healthy methods such as baking, boiling, grilling, and broiling instead.  Cook with  heart-healthy oils, such as olive, canola, soybean, or sunflower oil. Meal planning   Eat a balanced diet that includes: ? 5 or more servings of fruits and vegetables each day. At each meal, try to fill half of your plate with fruits and vegetables. ? Up to 6-8 servings of whole grains each day. ? Less than 6 oz of lean meat, poultry, or fish each day. A 3-oz serving of meat is about the same size as a deck of cards. One egg equals 1 oz. ? 2 servings of low-fat dairy each day. ? A serving of nuts, seeds, or beans 5 times each week. ? Heart-healthy fats. Healthy fats called Omega-3 fatty acids are found in foods such as flaxseeds and coldwater fish, like sardines, salmon, and mackerel.  Limit how much you eat of the following: ? Canned or prepackaged foods. ? Food that is high in trans fat, such as fried foods. ? Food that is high in saturated fat, such as fatty meat. ? Sweets, desserts, sugary drinks, and other foods with added sugar. ? Full-fat dairy products.  Do not salt foods before eating.  Try to eat at least 2 vegetarian meals each week.  Eat more home-cooked food and less restaurant, buffet, and fast food.  When eating at a restaurant, ask that your food be prepared with less salt or no salt, if possible. What foods are recommended? The items listed may not be a complete list. Talk with your dietitian about what   dietary choices are best for you. Grains Whole-grain or whole-wheat bread. Whole-grain or whole-wheat pasta. Brown rice. Oatmeal. Quinoa. Bulgur. Whole-grain and low-sodium cereals. Pita bread. Low-fat, low-sodium crackers. Whole-wheat flour tortillas. Vegetables Fresh or frozen vegetables (raw, steamed, roasted, or grilled). Low-sodium or reduced-sodium tomato and vegetable juice. Low-sodium or reduced-sodium tomato sauce and tomato paste. Low-sodium or reduced-sodium canned vegetables. Fruits All fresh, dried, or frozen fruit. Canned fruit in natural juice (without  added sugar). Meat and other protein foods Skinless chicken or turkey. Ground chicken or turkey. Pork with fat trimmed off. Fish and seafood. Egg whites. Dried beans, peas, or lentils. Unsalted nuts, nut butters, and seeds. Unsalted canned beans. Lean cuts of beef with fat trimmed off. Low-sodium, lean deli meat. Dairy Low-fat (1%) or fat-free (skim) milk. Fat-free, low-fat, or reduced-fat cheeses. Nonfat, low-sodium ricotta or cottage cheese. Low-fat or nonfat yogurt. Low-fat, low-sodium cheese. Fats and oils Soft margarine without trans fats. Vegetable oil. Low-fat, reduced-fat, or light mayonnaise and salad dressings (reduced-sodium). Canola, safflower, olive, soybean, and sunflower oils. Avocado. Seasoning and other foods Herbs. Spices. Seasoning mixes without salt. Unsalted popcorn and pretzels. Fat-free sweets. What foods are not recommended? The items listed may not be a complete list. Talk with your dietitian about what dietary choices are best for you. Grains Baked goods made with fat, such as croissants, muffins, or some breads. Dry pasta or rice meal packs. Vegetables Creamed or fried vegetables. Vegetables in a cheese sauce. Regular canned vegetables (not low-sodium or reduced-sodium). Regular canned tomato sauce and paste (not low-sodium or reduced-sodium). Regular tomato and vegetable juice (not low-sodium or reduced-sodium). Pickles. Olives. Fruits Canned fruit in a light or heavy syrup. Fried fruit. Fruit in cream or butter sauce. Meat and other protein foods Fatty cuts of meat. Ribs. Fried meat. Bacon. Sausage. Bologna and other processed lunch meats. Salami. Fatback. Hotdogs. Bratwurst. Salted nuts and seeds. Canned beans with added salt. Canned or smoked fish. Whole eggs or egg yolks. Chicken or turkey with skin. Dairy Whole or 2% milk, cream, and half-and-half. Whole or full-fat cream cheese. Whole-fat or sweetened yogurt. Full-fat cheese. Nondairy creamers. Whipped toppings.  Processed cheese and cheese spreads. Fats and oils Butter. Stick margarine. Lard. Shortening. Ghee. Bacon fat. Tropical oils, such as coconut, palm kernel, or palm oil. Seasoning and other foods Salted popcorn and pretzels. Onion salt, garlic salt, seasoned salt, table salt, and sea salt. Worcestershire sauce. Tartar sauce. Barbecue sauce. Teriyaki sauce. Soy sauce, including reduced-sodium. Steak sauce. Canned and packaged gravies. Fish sauce. Oyster sauce. Cocktail sauce. Horseradish that you find on the shelf. Ketchup. Mustard. Meat flavorings and tenderizers. Bouillon cubes. Hot sauce and Tabasco sauce. Premade or packaged marinades. Premade or packaged taco seasonings. Relishes. Regular salad dressings. Where to find more information:  National Heart, Lung, and Blood Institute: www.nhlbi.nih.gov  American Heart Association: www.heart.org Summary  The DASH eating plan is a healthy eating plan that has been shown to reduce high blood pressure (hypertension). It may also reduce your risk for type 2 diabetes, heart disease, and stroke.  With the DASH eating plan, you should limit salt (sodium) intake to 2,300 mg a day. If you have hypertension, you may need to reduce your sodium intake to 1,500 mg a day.  When on the DASH eating plan, aim to eat more fresh fruits and vegetables, whole grains, lean proteins, low-fat dairy, and heart-healthy fats.  Work with your health care provider or diet and nutrition specialist (dietitian) to adjust your eating plan to your individual   calorie needs. This information is not intended to replace advice given to you by your health care provider. Make sure you discuss any questions you have with your health care provider. Document Released: 01/20/2011 Document Revised: 01/25/2016 Document Reviewed: 01/25/2016 Elsevier Interactive Patient Education  2018 Elsevier Inc.  

## 2017-11-13 NOTE — Addendum Note (Signed)
Addended by: Pilar Grammes on: 11/13/2017 03:33 PM   Modules accepted: Orders

## 2017-11-24 ENCOUNTER — Other Ambulatory Visit (INDEPENDENT_AMBULATORY_CARE_PROVIDER_SITE_OTHER): Payer: Medicare Other

## 2017-11-24 DIAGNOSIS — Z1211 Encounter for screening for malignant neoplasm of colon: Secondary | ICD-10-CM

## 2017-11-24 LAB — FECAL OCCULT BLOOD, IMMUNOCHEMICAL: Fecal Occult Bld: NEGATIVE

## 2017-11-24 NOTE — Telephone Encounter (Signed)
Okay to await return of PCP.

## 2017-11-24 NOTE — Telephone Encounter (Signed)
Dr. Silvio Pate is out of the office.  Will send to Dr. Diona Browner to see if she can answer question for Ms. Parcell about Mumps.

## 2018-01-15 ENCOUNTER — Other Ambulatory Visit: Payer: Self-pay | Admitting: Internal Medicine

## 2018-01-15 DIAGNOSIS — Z1231 Encounter for screening mammogram for malignant neoplasm of breast: Secondary | ICD-10-CM

## 2018-01-24 ENCOUNTER — Ambulatory Visit
Admission: RE | Admit: 2018-01-24 | Discharge: 2018-01-24 | Disposition: A | Discharge status: Home / Self Care | Payer: Medicare Other | Source: Ambulatory Visit | Attending: Internal Medicine | Admitting: Internal Medicine

## 2018-01-24 DIAGNOSIS — Z1231 Encounter for screening mammogram for malignant neoplasm of breast: Secondary | ICD-10-CM | POA: Diagnosis present

## 2018-11-19 ENCOUNTER — Encounter: Payer: Self-pay | Admitting: Internal Medicine

## 2018-11-19 ENCOUNTER — Other Ambulatory Visit: Payer: Self-pay

## 2018-11-19 ENCOUNTER — Ambulatory Visit (INDEPENDENT_AMBULATORY_CARE_PROVIDER_SITE_OTHER): Payer: Medicare Other | Admitting: Internal Medicine

## 2018-11-19 VITALS — BP 138/90 | HR 81 | Temp 98.1°F | Ht 66.0 in | Wt 225.0 lb

## 2018-11-19 DIAGNOSIS — Z23 Encounter for immunization: Secondary | ICD-10-CM | POA: Diagnosis not present

## 2018-11-19 DIAGNOSIS — E538 Deficiency of other specified B group vitamins: Secondary | ICD-10-CM | POA: Diagnosis not present

## 2018-11-19 DIAGNOSIS — E669 Obesity, unspecified: Secondary | ICD-10-CM | POA: Diagnosis not present

## 2018-11-19 DIAGNOSIS — E785 Hyperlipidemia, unspecified: Secondary | ICD-10-CM

## 2018-11-19 DIAGNOSIS — G629 Polyneuropathy, unspecified: Secondary | ICD-10-CM

## 2018-11-19 DIAGNOSIS — Z0000 Encounter for general adult medical examination without abnormal findings: Secondary | ICD-10-CM

## 2018-11-19 DIAGNOSIS — Z1211 Encounter for screening for malignant neoplasm of colon: Secondary | ICD-10-CM

## 2018-11-19 DIAGNOSIS — Z7189 Other specified counseling: Secondary | ICD-10-CM

## 2018-11-19 LAB — COMPREHENSIVE METABOLIC PANEL
ALT: 18 U/L (ref 0–35)
AST: 24 U/L (ref 0–37)
Albumin: 4.2 g/dL (ref 3.5–5.2)
Alkaline Phosphatase: 64 U/L (ref 39–117)
BUN: 13 mg/dL (ref 6–23)
CO2: 27 mEq/L (ref 19–32)
Calcium: 9.3 mg/dL (ref 8.4–10.5)
Chloride: 107 mEq/L (ref 96–112)
Creatinine, Ser: 0.87 mg/dL (ref 0.40–1.20)
GFR: 63.97 mL/min (ref >60.00)
Glucose, Bld: 94 mg/dL (ref 70–99)
Potassium: 4.1 mEq/L (ref 3.5–5.1)
Sodium: 140 mEq/L (ref 135–145)
Total Bilirubin: 0.6 mg/dL (ref 0.2–1.2)
Total Protein: 7 g/dL (ref 6.0–8.3)

## 2018-11-19 LAB — LIPID PANEL
Cholesterol: 231 mg/dL — ABNORMAL HIGH (ref 0–200)
HDL: 41.2 mg/dL (ref >39.00)
NonHDL: 189.49
Total CHOL/HDL Ratio: 6
Triglycerides: 211 mg/dL — ABNORMAL HIGH (ref 0.0–149.0)
VLDL: 42.2 mg/dL — ABNORMAL HIGH (ref 0.0–40.0)

## 2018-11-19 LAB — LDL CHOLESTEROL, DIRECT: Direct LDL: 143 mg/dL

## 2018-11-19 LAB — CBC
HCT: 43.4 % (ref 36.0–46.0)
Hemoglobin: 15 g/dL (ref 12.0–15.0)
MCHC: 34.5 g/dL (ref 30.0–36.0)
MCV: 90.5 fl (ref 78.0–100.0)
Platelets: 253 10*3/uL (ref 150.0–400.0)
RBC: 4.79 Mil/uL (ref 3.87–5.11)
RDW: 12.9 % (ref 11.5–15.5)
WBC: 6.2 10*3/uL (ref 4.0–10.5)

## 2018-11-19 LAB — VITAMIN B12: Vitamin B-12: 706 pg/mL (ref 211–911)

## 2018-11-19 NOTE — Assessment & Plan Note (Signed)
See social history 

## 2018-11-19 NOTE — Progress Notes (Signed)
Hearing Screening   125Hz  250Hz  500Hz  1000Hz  2000Hz  3000Hz  4000Hz  6000Hz  8000Hz   Right ear:           Left ear:           Comments: Has Hearing Aids. Wearing them today.  Vision Screening Comments: August 2020

## 2018-11-19 NOTE — Assessment & Plan Note (Signed)
I have personally reviewed the Medicare Annual Wellness questionnaire and have noted 1. The patient's medical and social history 2. Their use of alcohol, tobacco or illicit drugs 3. Their current medications and supplements 4. The patient's functional ability including ADL's, fall risks, home safety risks and hearing or visual             impairment. 5. Diet and physical activities 6. Evidence for depression or mood disorders  The patients weight, height, BMI and visual acuity have been recorded in the chart I have made referrals, counseling and provided education to the patient based review of the above and I have provided the pt with a written personalized care plan for preventive services.  I have provided you with a copy of your personalized plan for preventive services. Please take the time to review along with your updated medication list.  Flu vaccine today Mammogram due next December FIT No pap due to age Discussed fitness

## 2018-11-19 NOTE — Progress Notes (Signed)
Subjective:    Patient ID: [name], female    DOB: [birth year], 73 y.o.   MRN: [id number]  HPI Here for Medicare wellness visit and follow up of chronic health conditions Reviewed form and advanced directives Reviewed other doctors Vision is okay--on drops for glaucoma Continues with hearing aides Exercising more regularly now Will have 1-2 glasses of wine occasionally No tobacco No falls No depression of note---not anhedonic Independent with instrumental ADLs Memory is fine  No longer caregiver Mom did finally die in November---got sick and then went back into hospice She is doing okay with this--but having some guilt wondering if they could have done more Sold house--now in apartment   Still with neuropathy---not a big deal Not really painful In feet only Not great balance--but not bad.  Does get vertigo at times (and saw Dr [name] once)--had otitis externa I guess  Continues on vitamin B12, D and occasionally fish oil No joint concerns  Current Outpatient Medications on File Prior to Visit  Medication Sig Dispense Refill  . brimonidine-timolol (COMBIGAN) 0.2-0.5 % ophthalmic solution Place 1 drop into both eyes every 12 (twelve) hours.    . Cholecalciferol (VITAMIN D-3 PO) Take by mouth.    . Fish Oil OIL by Does not apply route.    . vitamin B-12 (CYANOCOBALAMIN) 1000 MCG tablet Take 1,000 mcg by mouth daily.     No current facility-administered medications on file prior to visit.     No Known Allergies  Past Medical History:  Diagnosis Date  . BPPV (benign paroxysmal positional vertigo)   . Endometrial cancer ([place]) 2010   s/p hysterectomy  . Glaucoma    Dr Gaspar Bidding [facility])    Past Surgical History:  Procedure Laterality Date  . ABDOMINAL HYSTERECTOMY  2010   Endometrial Ca  . CHOLECYSTECTOMY  2010  . OOPHORECTOMY Bilateral   . TONSILLECTOMY      Family History  Problem Relation Age of Onset  . Cancer Father        colon  . Heart  disease Neg Hx   . Diabetes Neg Hx   . Breast cancer Neg Hx     Social History   Socioeconomic History  . Marital status: Single    Spouse name: Not on file  . Number of children: 1  . Years of education: Not on file  . Highest education level: Not on file  Occupational History  . 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 Financial resource strain: Not on file  . Food insecurity    Worry: Not on file    Inability: Not on file  . Transportation needs    Medical: Not on file    Non-medical: Not on file  Tobacco Use  . Smoking status: Never Smoker  . Smokeless tobacco: Never Used  Substance and Sexual Activity  . Alcohol use: Yes    Comment: Rarely  . Drug use: No  . Sexual activity: Not on file  Lifestyle  . Physical activity    Days per week: Not on file    Minutes per session: Not on file  . Stress: Not on file  Relationships  . Social Herbalist on phone: Not on file    Gets together: Not on file    Attends religious service: Not on file    Active member of club or organization: Not on file    Attends meetings of clubs or organizations: Not on  file    Relationship status: Not on file  . Intimate partner violence    Fear of current or ex partner: Not on file    Emotionally abused: Not on file    Physically abused: Not on file    Forced sexual activity: Not on file  Other Topics Concern  . Not on file  Social History Narrative   Lives with mother in [place]. 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      Has living will   No health care POA-----sister [name]   Would accept resuscitation   Hasn't considered tube feeds      Review of Systems Appetite is fine Weight is down slightly Wears seat belt Sleeps fine most of the time No problems with teeth--sees dentist No acute issues with skin--goes yearly to derm No heartburn or dysphagia Bowels move fine--no blood No chest pain or SOB No dizziness or syncope No  edema No palpitations No sig joint of back pains--mild knee pains    Objective:   Physical Exam  Constitutional: She is oriented to person, place, and time. She appears well-developed. No distress.  HENT:  Mouth/Throat: Oropharynx is clear and moist. No oropharyngeal exudate.  Neck: No thyromegaly present.  Cardiovascular: Normal rate, regular rhythm, normal heart sounds and intact distal pulses. Exam reveals no gallop.  No murmur heard. Respiratory: Effort normal and breath sounds normal. No respiratory distress. She has no wheezes. She has no rales.  GI: Soft. There is no abdominal tenderness.  Musculoskeletal:        General: No tenderness or edema.     Comments: Synovial cyst left 2nd PIP  Lymphadenopathy:    She has no cervical adenopathy.  Neurological: She is alert and oriented to person, place, and time.  President--- "[name]" [phone] [id number] Recall 3/3  Skin: No rash noted. No erythema.  Psychiatric: She has a normal mood and affect. Her behavior is normal.           Assessment & Plan:

## 2018-11-19 NOTE — Assessment & Plan Note (Signed)
Mild and persists No sig pain (feet only)

## 2018-11-19 NOTE — Assessment & Plan Note (Signed)
She is not excited about Rx Will just recheck

## 2018-11-19 NOTE — Assessment & Plan Note (Signed)
She is working on lifestyle

## 2018-11-19 NOTE — Assessment & Plan Note (Signed)
Is on supplements

## 2019-03-08 ENCOUNTER — Ambulatory Visit: Payer: Medicare PPO | Attending: Internal Medicine

## 2019-03-08 DIAGNOSIS — Z23 Encounter for immunization: Secondary | ICD-10-CM | POA: Insufficient documentation

## 2019-03-08 NOTE — Progress Notes (Signed)
   Covid-19 Vaccination Clinic  Name:  [name]    MRN: [id number] DOB: Sep 05, 1945  03/08/2019  [name] was observed post Covid-19 immunization for 15 minutes without incidence. She was provided with Vaccine Information Sheet and instruction to access the V-Safe system.   [name] was instructed to call 911 with any severe reactions post vaccine: Marland Kitchen Difficulty breathing  . Swelling of your face and throat  . A fast heartbeat  . A bad rash all over your body  . Dizziness and weakness    Immunizations Administered    Name Date Dose VIS Date Route   Pfizer COVID-19 Vaccine 03/08/2019  1:03 PM 0.3 mL 01/25/2019 Intramuscular   Manufacturer: [place]   Lot: [id number]   [place]: [id number]

## 2019-03-22 ENCOUNTER — Telehealth: Payer: Self-pay | Admitting: Radiology

## 2019-03-22 NOTE — Telephone Encounter (Signed)
Due to the pandemic mail was held up and several IFOB kits were received in the [facility] lab on approximately January 20th 2021 dating back as far as Septometer 2020. New IFOB kits have been mailed as of March 21, 2019 to all patients affected along with written communication as to why they received another IFOB and that they were not charges for the prior test.   

## 2019-03-26 ENCOUNTER — Ambulatory Visit: Payer: Medicare PPO | Attending: Internal Medicine

## 2019-03-26 DIAGNOSIS — Z23 Encounter for immunization: Secondary | ICD-10-CM

## 2019-03-26 NOTE — Progress Notes (Signed)
   Covid-19 Vaccination Clinic  Name:  [name]    MRN: [id number] DOB: [birth year]  03/26/2019  [name] was observed post Covid-19 immunization for 15 minutes without incidence. She was provided with Vaccine Information Sheet and instruction to access the V-Safe system.   [name] was instructed to call 911 with any severe reactions post vaccine: Marland Kitchen Difficulty breathing  . Swelling of your face and throat  . A fast heartbeat  . A bad rash all over your body  . Dizziness and weakness    Immunizations Administered    Name Date Dose VIS Date Route   Pfizer COVID-19 Vaccine 03/26/2019  8:27 AM 0.3 mL 01/25/2019 Intramuscular   Manufacturer: [place]   Lot: [id number]   [place]: [id number]

## 2019-03-29 ENCOUNTER — Other Ambulatory Visit (INDEPENDENT_AMBULATORY_CARE_PROVIDER_SITE_OTHER): Payer: Medicare PPO

## 2019-03-29 DIAGNOSIS — Z1211 Encounter for screening for malignant neoplasm of colon: Secondary | ICD-10-CM

## 2019-03-29 LAB — FECAL OCCULT BLOOD, IMMUNOCHEMICAL: Fecal Occult Bld: NEGATIVE

## 2019-04-08 DIAGNOSIS — H401131 Primary open-angle glaucoma, bilateral, mild stage: Secondary | ICD-10-CM | POA: Diagnosis not present

## 2019-04-08 DIAGNOSIS — H25093 Other age-related incipient cataract, bilateral: Secondary | ICD-10-CM | POA: Diagnosis not present

## 2019-06-19 ENCOUNTER — Encounter (INDEPENDENT_AMBULATORY_CARE_PROVIDER_SITE_OTHER): Payer: Self-pay | Admitting: Family Medicine

## 2019-06-19 ENCOUNTER — Other Ambulatory Visit: Payer: Self-pay

## 2019-06-19 ENCOUNTER — Ambulatory Visit (INDEPENDENT_AMBULATORY_CARE_PROVIDER_SITE_OTHER): Payer: Medicare PPO | Admitting: Family Medicine

## 2019-06-19 VITALS — BP 145/83 | HR 77 | Temp 98.2°F | Ht 66.0 in | Wt 219.0 lb

## 2019-06-19 DIAGNOSIS — E538 Deficiency of other specified B group vitamins: Secondary | ICD-10-CM | POA: Diagnosis not present

## 2019-06-19 DIAGNOSIS — R0602 Shortness of breath: Secondary | ICD-10-CM | POA: Diagnosis not present

## 2019-06-19 DIAGNOSIS — Z6835 Body mass index (BMI) 35.0-35.9, adult: Secondary | ICD-10-CM

## 2019-06-19 DIAGNOSIS — Z0289 Encounter for other administrative examinations: Secondary | ICD-10-CM

## 2019-06-19 DIAGNOSIS — Z1331 Encounter for screening for depression: Secondary | ICD-10-CM | POA: Diagnosis not present

## 2019-06-19 DIAGNOSIS — E559 Vitamin D deficiency, unspecified: Secondary | ICD-10-CM | POA: Diagnosis not present

## 2019-06-19 DIAGNOSIS — R5383 Other fatigue: Secondary | ICD-10-CM | POA: Diagnosis not present

## 2019-06-19 DIAGNOSIS — E7849 Other hyperlipidemia: Secondary | ICD-10-CM

## 2019-06-19 NOTE — Progress Notes (Signed)
Chief Complaint:   [name] (MR# [id number]) is a 74 y.o. female who presents for evaluation and treatment of obesity and related comorbidities. Current BMI is Body mass index is 35.35 kg/m. Aoi has been struggling with her weight for many years and has been unsuccessful in either losing weight, maintaining weight loss, or reaching her healthy weight goal.  Trichia is currently in the action stage of change and ready to dedicate time achieving and maintaining a healthier weight. Sloka is interested in becoming our patient and working on intensive lifestyle modifications including (but not limited to) diet and exercise for weight loss.  Shanavia heard about the clinic from the internet.  She is a retired Pharmacist, hospital.  For breakfast, she will have 1 cup of Cheerios, 1 tbsp flax, 1/2 cup blueberry with water.  At around 12 pm, 1 cup [place] yogurt with fruit and nuts (satisfied).  Mid-afternoon, vegetables and hummus or crackers and cheese.  For dinner, she will have 3-4 ounces of salmon or chicken with sauteed spinach (feels full).  She will occasionally have an after dinner snack.  Starletta's habits were reviewed today and are as follows: her desired weight loss is 73 pounds, she has been heavy most of her life, she started gaining weight in her 46s, her heaviest weight ever was 264 pounds, she craves sweets and she struggles with emotional eating.  Depression Screen Kandyce's Food and Mood (modified PHQ-9) score was 13.  Depression screen PHQ 2/9 06/19/2019  Decreased Interest 3  Down, Depressed, Hopeless 2  PHQ - 2 Score 5  Altered sleeping 1  Tired, decreased energy 3  Change in appetite 2  Feeling bad or failure about yourself  1  Trouble concentrating 0  Moving slowly or fidgety/restless 1  Suicidal thoughts 0  PHQ-9 Score 13  Difficult doing work/chores Not difficult at all   Subjective:   1. Other fatigue Darielis denies daytime somnolence and denies waking up still tired.  Deaja generally gets 7 hours of sleep per night, and states that she has generally restful sleep. Snoring is not present. Apneic episodes are not present. Epworth Sleepiness Score is 3.  2. SOB (shortness of breath) on exertion [name] notes increasing shortness of breath with exercising and seems to be worsening over time with weight gain. She notes getting out of breath sooner with activity than she used to. This has gotten worse recently. Apryle denies shortness of breath at rest or orthopnea.  3. Vitamin D deficiency Last vitamin D level was 2 years ago. She is currently taking OTC vitamin D 1000 IU each day. She denies nausea, vomiting or muscle weakness.  4. Vitamin B12 deficiency Last B12 level was within normal limits.  She is taking an oral B12 supplement.  5. Other hyperlipidemia Skyy has hyperlipidemia and has been trying to improve her cholesterol levels with intensive lifestyle modification including a low saturated fat diet, exercise and weight loss. She denies any chest pain, claudication or myalgias.  Rachele is not taking a statin.  Lab Results  Component Value Date   ALT 18 11/19/2018   AST 24 11/19/2018   ALKPHOS 64 11/19/2018   BILITOT 0.6 11/19/2018   Lab Results  Component Value Date   CHOL 231 (H) 11/19/2018   HDL 41.20 11/19/2018   LDLCALC 101 (H) 12/02/2011   LDLDIRECT 143.0 11/19/2018   TRIG 211.0 (H) 11/19/2018   CHOLHDL 6 11/19/2018   6. Depression screening Marlesa was screened for depression as  part of her new patient workup today.  Assessment/Plan:   1. Other fatigue Dilan does feel that her weight is causing her energy to be lower than it should be. Fatigue may be related to obesity, depression or many other causes. Labs will be ordered, and in the meanwhile, Metzly will focus on self care including making healthy food choices, increasing physical activity and focusing on stress reduction. - EKG 12-Lead - Comprehensive metabolic panel - CBC with  Differential/Platelet - T3 - T4, free - TSH  2. SOB (shortness of breath) on exertion Samira does feel that she gets out of breath more easily that she used to when she exercises. Julieana's shortness of breath appears to be obesity related and exercise induced. She has agreed to work on weight loss and gradually increase exercise to treat her exercise induced shortness of breath. Will continue to monitor closely. - Lipid Panel With LDL/HDL Ratio  3. Vitamin D deficiency Low Vitamin D level contributes to fatigue and are associated with obesity, breast, and colon cancer.  Will check vitamin D level today. - VITAMIN D 25 Hydroxy (Vit-D Deficiency, Fractures)  4. Vitamin B12 deficiency The diagnosis was reviewed with the patient. Counseling provided today, see below. We will continue to monitor. Orders and follow up as documented in patient record.  Will check B12 level today.  Counseling . The body needs vitamin B12: to make red blood cells; to make DNA; and to help the nerves work properly so they can carry messages from the brain to the body.  . The main causes of vitamin B12 deficiency include dietary deficiency, digestive diseases, pernicious anemia, and having a surgery in which part of the stomach or small intestine is removed.  . Certain medicines can make it harder for the body to absorb vitamin B12. These medicines include: heartburn medications; some antibiotics; some medications used to treat diabetes, gout, and high cholesterol.  . In some cases, there are no symptoms of this condition. If the condition leads to anemia or nerve damage, various symptoms can occur, such as weakness or fatigue, shortness of breath, and numbness or tingling in your hands and feet.   . Treatment:  o May include taking vitamin B12 supplements.  o Avoid alcohol.  o Eat lots of healthy foods that contain vitamin B12: - Beef, pork, chicken, [place], and organ meats, such as liver.  - Seafood: This includes  clams, rainbow trout, salmon, tuna, and haddock. Eggs.  - Cereal and dairy products that are fortified: This means that vitamin B12 has been added to the food.  - Vitamin B12  5. Other hyperlipidemia Cardiovascular risk and specific lipid/LDL goals reviewed.  We discussed several lifestyle modifications today and Damie will continue to work on diet, exercise and weight loss efforts. Orders and follow up as documented in patient record.   Counseling Intensive lifestyle modifications are the first line treatment for this issue. . Dietary changes: Increase soluble fiber. Decrease simple carbohydrates. . Exercise changes: Moderate to vigorous-intensity aerobic activity 150 minutes per week if tolerated. . Lipid-lowering medications: see documented in medical record. - Lipid Panel With LDL/HDL Ratio  6. Depression screening Janeice had a positive depression screening. Depression is commonly associated with obesity and often results in emotional eating behaviors. We will monitor this closely and work on CBT to help improve the non-hunger eating patterns. Referral to Psychology may be required if no improvement is seen as she continues in our clinic.  7. Class 2 severe obesity with serious  comorbidity and body mass index (BMI) of 35.0 to 35.9 in adult, unspecified obesity type [facility]) Takita is currently in the action stage of change and her goal is to continue with weight loss efforts. I recommend Jlisa begin the structured treatment plan as follows:  She has agreed to the [employer] +200 calories.  Exercise goals: No exercise has been prescribed at this time.   Behavioral modification strategies: increasing lean protein intake, meal planning and cooking strategies, keeping healthy foods in the home and planning for success.  She was informed of the importance of frequent follow-up visits to maximize her success with intensive lifestyle modifications for her multiple health conditions. She was  informed we would discuss her lab results at her next visit unless there is a critical issue that needs to be addressed sooner. Shniya agreed to keep her next visit at the agreed upon time to discuss these results.  Objective:   Blood pressure (!) 145/83, pulse 77, temperature 98.2 F (36.8 C), temperature source Oral, height 5\' 6"  (1.676 m), weight 219 lb (99.3 kg), SpO2 97 %. Body mass index is 35.35 kg/m.  EKG: Normal sinus rhythm, rate 74 bpm.  Indirect Calorimeter completed today shows a VO2 of 233 and a REE of 1624.  Her calculated basal metabolic rate is [id number] thus her basal metabolic rate is better than expected.  General: Cooperative, alert, well developed, in no acute distress. HEENT: Conjunctivae and lids unremarkable. Cardiovascular: Regular rhythm.  Lungs: Normal work of breathing. Neurologic: No focal deficits.   Lab Results  Component Value Date   CREATININE 0.87 11/19/2018   BUN 13 11/19/2018   NA 140 11/19/2018   K 4.1 11/19/2018   CL 107 11/19/2018   CO2 27 11/19/2018   Lab Results  Component Value Date   ALT 18 11/19/2018   AST 24 11/19/2018   ALKPHOS 64 11/19/2018   BILITOT 0.6 11/19/2018   Lab Results  Component Value Date   TSH 1.02 07/04/2014   Lab Results  Component Value Date   CHOL 231 (H) 11/19/2018   HDL 41.20 11/19/2018   LDLCALC 101 (H) 12/02/2011   LDLDIRECT 143.0 11/19/2018   TRIG 211.0 (H) 11/19/2018   CHOLHDL 6 11/19/2018   Lab Results  Component Value Date   WBC 6.2 11/19/2018   HGB 15.0 11/19/2018   HCT 43.4 11/19/2018   MCV 90.5 11/19/2018   PLT 253.0 11/19/2018   Obesity Behavioral Intervention Visit Documentation for Insurance:   Approximately 15 minutes were spent on the discussion below.  ASK: We discussed the diagnosis of obesity with [name] today and Romy agreed to give [place] permission to discuss obesity behavioral modification therapy today.  ASSESS: Weda has the diagnosis of obesity and her BMI today is 35.5.  Lutitia is in the action stage of change.   ADVISE: Swanzetta was educated on the multiple health risks of obesity as well as the benefit of weight loss to improve her health. She was advised of the need for long term treatment and the importance of lifestyle modifications to improve her current health and to decrease her risk of future health problems.  AGREE: Multiple dietary modification options and treatment options were discussed and Kinnidi agreed to follow the recommendations documented in the above note.  ARRANGE: Kaylyne was educated on the importance of frequent visits to treat obesity as outlined per CMS and USPSTF guidelines and agreed to schedule her next follow up appointment today.  Attestation Statements:   Reviewed by Pension scheme manager on  day of visit: allergies, medications, problem list, medical history, surgical history, family history, social history, and previous encounter notes.  I, Water quality scientist, CMA, am acting as transcriptionist for Coralie Common, MD.  I have reviewed the above documentation for accuracy and completeness, and I agree with the above. - Ilene Qua, MD

## 2019-06-20 LAB — COMPREHENSIVE METABOLIC PANEL WITH GFR
ALT: 15 IU/L (ref 0–32)
AST: 25 IU/L (ref 0–40)
Albumin/Globulin Ratio: 1.5 (ref 1.2–2.2)
Albumin: 4.2 g/dL (ref 3.7–4.7)
Alkaline Phosphatase: 100 IU/L (ref 39–117)
BUN/Creatinine Ratio: 18 (ref 12–28)
BUN: 16 mg/dL (ref 8–27)
Bilirubin Total: 0.4 mg/dL (ref 0.0–1.2)
CO2: 23 mmol/L (ref 20–29)
Calcium: 9.2 mg/dL (ref 8.7–10.3)
Chloride: 104 mmol/L (ref 96–106)
Creatinine, Ser: 0.91 mg/dL (ref 0.57–1.00)
GFR calc Af Amer: 73 mL/min/1.73
GFR calc non Af Amer: 63 mL/min/1.73
Globulin, Total: 2.8 g/dL (ref 1.5–4.5)
Glucose: 89 mg/dL (ref 65–99)
Potassium: 4.4 mmol/L (ref 3.5–5.2)
Sodium: 141 mmol/L (ref 134–144)
Total Protein: 7 g/dL (ref 6.0–8.5)

## 2019-06-20 LAB — CBC WITH DIFFERENTIAL/PLATELET
Basophils Absolute: 0 x10E3/uL (ref 0.0–0.2)
Basos: 1 %
EOS (ABSOLUTE): 0.1 x10E3/uL (ref 0.0–0.4)
Eos: 1 %
Hematocrit: 47.5 % — ABNORMAL HIGH (ref 34.0–46.6)
Hemoglobin: 15.5 g/dL (ref 11.1–15.9)
Immature Grans (Abs): 0 x10E3/uL (ref 0.0–0.1)
Immature Granulocytes: 0 %
Lymphocytes Absolute: 2.2 x10E3/uL (ref 0.7–3.1)
Lymphs: 26 %
MCH: 30.3 pg (ref 26.6–33.0)
MCHC: 32.6 g/dL (ref 31.5–35.7)
MCV: 93 fL (ref 79–97)
Monocytes Absolute: 0.6 x10E3/uL (ref 0.1–0.9)
Monocytes: 7 %
Neutrophils Absolute: 5.7 x10E3/uL (ref 1.4–7.0)
Neutrophils: 65 %
Platelets: 276 x10E3/uL (ref 150–450)
RBC: 5.12 x10E6/uL (ref 3.77–5.28)
RDW: 13.1 % (ref 11.7–15.4)
WBC: 8.7 x10E3/uL (ref 3.4–10.8)

## 2019-06-20 LAB — VITAMIN B12: Vitamin B-12: 1146 pg/mL (ref 232–1245)

## 2019-06-20 LAB — LIPID PANEL WITH LDL/HDL RATIO
Cholesterol, Total: 252 mg/dL — ABNORMAL HIGH (ref 100–199)
HDL: 52 mg/dL (ref >39)
LDL Chol Calc (NIH): 162 mg/dL — ABNORMAL HIGH (ref 0–99)
LDL/HDL Ratio: 3.1 ratio (ref 0.0–3.2)
Triglycerides: 207 mg/dL — ABNORMAL HIGH (ref 0–149)
VLDL Cholesterol Cal: 38 mg/dL (ref 5–40)

## 2019-06-20 LAB — T4, FREE: Free T4: 1.16 ng/dL (ref 0.82–1.77)

## 2019-06-20 LAB — VITAMIN D 25 HYDROXY (VIT D DEFICIENCY, FRACTURES): Vit D, 25-Hydroxy: 32.5 ng/mL (ref 30.0–100.0)

## 2019-06-20 LAB — T3: T3, Total: 122 ng/dL (ref 71–180)

## 2019-06-20 LAB — TSH: TSH: 1.22 u[IU]/mL (ref 0.450–4.500)

## 2019-07-03 ENCOUNTER — Ambulatory Visit (INDEPENDENT_AMBULATORY_CARE_PROVIDER_SITE_OTHER): Payer: Medicare PPO | Admitting: Family Medicine

## 2019-07-03 ENCOUNTER — Other Ambulatory Visit (INDEPENDENT_AMBULATORY_CARE_PROVIDER_SITE_OTHER): Payer: Self-pay | Admitting: Family Medicine

## 2019-07-03 ENCOUNTER — Other Ambulatory Visit: Payer: Self-pay

## 2019-07-03 ENCOUNTER — Encounter (INDEPENDENT_AMBULATORY_CARE_PROVIDER_SITE_OTHER): Payer: Self-pay | Admitting: Family Medicine

## 2019-07-03 VITALS — BP 131/80 | HR 75 | Temp 98.1°F | Ht 66.0 in | Wt 218.0 lb

## 2019-07-03 DIAGNOSIS — E559 Vitamin D deficiency, unspecified: Secondary | ICD-10-CM

## 2019-07-03 DIAGNOSIS — Z6835 Body mass index (BMI) 35.0-35.9, adult: Secondary | ICD-10-CM

## 2019-07-03 DIAGNOSIS — E7849 Other hyperlipidemia: Secondary | ICD-10-CM | POA: Diagnosis not present

## 2019-07-03 DIAGNOSIS — E538 Deficiency of other specified B group vitamins: Secondary | ICD-10-CM | POA: Diagnosis not present

## 2019-07-03 MED ORDER — VITAMIN D (ERGOCALCIFEROL) 1.25 MG (50000 UNIT) PO CAPS: 50000.0000 [IU] | ORAL_CAPSULE | ORAL | 0 refills | Status: DC

## 2019-07-03 NOTE — Progress Notes (Signed)
Chief Complaint:   OBESITY [name] is here to discuss her progress with her obesity treatment plan along with follow-up of her obesity related diagnoses. [name] is on the [employer] and states she is following her eating plan approximately 95% of the time. [name] states she is working with a Clinical research associate for 45 minutes 2 times per week and walking 1-2 miles for 45 minutes 7 times per week.  Today's visit was #: 2 Starting weight: 219 lbs Starting date: 06/19/2019 Today's weight: 218 lbs Today's date: 07/03/2019 Total lbs lost to date: 1 lb Total lbs lost since last in-office visit: 1 lb  Interim History: [name] says that during the first few days she felt that the plan was a significant amount of food, but then she started getting hungry 2-2.5 hours after a meal.  She wants a little more variety than just seafood.  She may want to introduce some other protein.  She did not always get her snacks in.  Subjective:   1. Vitamin D deficiency [name]'s Vitamin D level was 32.5 on 06/19/2019. She is currently taking OTC vitamin D.  She denies nausea, vomiting or muscle weakness.  She endorses fatigue.  2. Other hyperlipidemia [name] has hyperlipidemia and has been trying to improve her cholesterol levels with intensive lifestyle modification including a low saturated fat diet, exercise and weight loss. She denies any chest pain, claudication or myalgias.  She is not on a statin.  Her 10 year ASCVD risk factor is 10.9% (moderate intensity statin encouraged).  Lab Results  Component Value Date   ALT 15 06/19/2019   AST 25 06/19/2019   ALKPHOS 100 06/19/2019   BILITOT 0.4 06/19/2019   Lab Results  Component Value Date   CHOL 252 (H) 06/19/2019   HDL 52 06/19/2019   LDLCALC 162 (H) 06/19/2019   LDLDIRECT 143.0 11/19/2018   TRIG 207 (H) 06/19/2019   CHOLHDL 6 11/19/2018   3. Vitamin B12 deficiency She notes fatigue. She is not a vegetarian.  She does not have a previous diagnosis of  pernicious anemia.  She does not have a history of weight loss surgery.  She is taking sublingual vitamin B12.  Lab Results  Component Value Date   VITAMINB12 1,146 06/19/2019   Assessment/Plan:   1. Vitamin D deficiency Low Vitamin D level contributes to fatigue and are associated with obesity, breast, and colon cancer. She agrees to start to take prescription Vitamin D @50 ,000 IU every week and will follow-up for routine testing of Vitamin D, at least 2-3 times per year to avoid over-replacement. - Vitamin D, Ergocalciferol, (DRISDOL) 1.25 MG (50000 UNIT) CAPS capsule; Take 1 capsule (50,000 Units total) by mouth every 7 (seven) days.  Dispense: 4 capsule; Refill: 0  2. Other hyperlipidemia Cardiovascular risk and specific lipid/LDL goals reviewed.  We discussed several lifestyle modifications today and [name] will continue to work on diet, exercise and weight loss efforts. Orders and follow up as documented in patient record.  Will repeat labs in 3 months.  Pending results we will discuss a statin at that time.  Counseling Intensive lifestyle modifications are the first line treatment for this issue. . Dietary changes: Increase soluble fiber. Decrease simple carbohydrates. . Exercise changes: Moderate to vigorous-intensity aerobic activity 150 minutes per week if tolerated. . Lipid-lowering medications: see documented in medical record.  3. Vitamin B12 deficiency The diagnosis was reviewed with the patient. Counseling provided today, see below. We will continue to monitor. Orders and follow up  as documented in patient record.  Counseling . The body needs vitamin B12: to make red blood cells; to make DNA; and to help the nerves work properly so they can carry messages from the brain to the body.  . The main causes of vitamin B12 deficiency include dietary deficiency, digestive diseases, pernicious anemia, and having a surgery in which part of the stomach or small intestine is removed.   . Certain medicines can make it harder for the body to absorb vitamin B12. These medicines include: heartburn medications; some antibiotics; some medications used to treat diabetes, gout, and high cholesterol.  . In some cases, there are no symptoms of this condition. If the condition leads to anemia or nerve damage, various symptoms can occur, such as weakness or fatigue, shortness of breath, and numbness or tingling in your hands and feet.   . Treatment:  o May include taking vitamin B12 supplements.  o Avoid alcohol.  o Eat lots of healthy foods that contain vitamin B12: - Beef, pork, chicken, [place], and organ meats, such as liver.  - Seafood: This includes clams, rainbow trout, salmon, tuna, and haddock. Eggs.  - Cereal and dairy products that are fortified: This means that vitamin B12 has been added to the food.   4. Class 2 severe obesity with serious comorbidity and body mass index (BMI) of 35.0 to 35.9 in adult, unspecified obesity type [name]) [name] is currently in the action stage of change. As such, her goal is to continue with weight loss efforts. She has agreed to the Category 3 Plan and the [place] +300 calories.   Exercise goals: As is.  Behavioral modification strategies: increasing lean protein intake, increasing vegetables, meal planning and cooking strategies and planning for success.  [name] has agreed to follow-up with our clinic in 2 weeks. She was informed of the importance of frequent follow-up visits to maximize her success with intensive lifestyle modifications for her multiple health conditions.   Objective:   Blood pressure 131/80, pulse 75, temperature 98.1 F (36.7 C), temperature source Oral, height 5\' 6"  (1.676 m), weight 218 lb (98.9 kg), SpO2 95 %. Body mass index is 35.19 kg/m.  General: Cooperative, alert, well developed, in no acute distress. HEENT: Conjunctivae and lids unremarkable. Cardiovascular: Regular rhythm.  Lungs: Normal work of  breathing. Neurologic: No focal deficits.   Lab Results  Component Value Date   CREATININE 0.91 06/19/2019   BUN 16 06/19/2019   NA 141 06/19/2019   K 4.4 06/19/2019   CL 104 06/19/2019   CO2 23 06/19/2019   Lab Results  Component Value Date   ALT 15 06/19/2019   AST 25 06/19/2019   ALKPHOS 100 06/19/2019   BILITOT 0.4 06/19/2019   Lab Results  Component Value Date   TSH 1.220 06/19/2019   Lab Results  Component Value Date   CHOL 252 (H) 06/19/2019   HDL 52 06/19/2019   LDLCALC 162 (H) 06/19/2019   LDLDIRECT 143.0 11/19/2018   TRIG 207 (H) 06/19/2019   CHOLHDL 6 11/19/2018   Lab Results  Component Value Date   WBC 8.7 06/19/2019   HGB 15.5 06/19/2019   HCT 47.5 (H) 06/19/2019   MCV 93 06/19/2019   PLT 276 06/19/2019   Obesity Behavioral Intervention Documentation for Insurance:   Approximately 15 minutes were spent on the discussion below.  ASK: We discussed the diagnosis of obesity with [name] today and [name] agreed to give [place] permission to discuss obesity behavioral modification therapy today.  ASSESS: [name] has the diagnosis of obesity and her BMI today is 35.2. [name] is in the action stage of change.   ADVISE: [name] was educated on the multiple health risks of obesity as well as the benefit of weight loss to improve her health. She was advised of the need for long term treatment and the importance of lifestyle modifications to improve her current health and to decrease her risk of future health problems.  AGREE: Multiple dietary modification options and treatment options were discussed and [name] agreed to follow the recommendations documented in the above note.  ARRANGE: [name] was educated on the importance of frequent visits to treat obesity as outlined per CMS and USPSTF guidelines and agreed to schedule her next follow up appointment today.  Attestation Statements:   Reviewed by clinician on day of visit: allergies, medications, problem list, medical  history, surgical history, family history, social history, and previous encounter notes.  I, [name], CMA, am acting as transcriptionist for Coralie Common, MD.  I have reviewed the above documentation for accuracy and completeness, and I agree with the above. - Shelby Blossom, MD

## 2019-07-08 ENCOUNTER — Encounter (INDEPENDENT_AMBULATORY_CARE_PROVIDER_SITE_OTHER): Payer: Self-pay | Admitting: Family Medicine

## 2019-07-08 NOTE — Telephone Encounter (Signed)
Please advise 

## 2019-07-23 ENCOUNTER — Encounter (INDEPENDENT_AMBULATORY_CARE_PROVIDER_SITE_OTHER): Payer: Self-pay | Admitting: Family Medicine

## 2019-07-23 ENCOUNTER — Ambulatory Visit (INDEPENDENT_AMBULATORY_CARE_PROVIDER_SITE_OTHER): Payer: Medicare PPO | Admitting: Family Medicine

## 2019-07-23 ENCOUNTER — Other Ambulatory Visit: Payer: Self-pay

## 2019-07-23 VITALS — BP 138/87 | HR 71 | Temp 97.9°F | Ht 66.0 in | Wt 213.0 lb

## 2019-07-23 DIAGNOSIS — E7849 Other hyperlipidemia: Secondary | ICD-10-CM | POA: Diagnosis not present

## 2019-07-23 DIAGNOSIS — E669 Obesity, unspecified: Secondary | ICD-10-CM

## 2019-07-23 DIAGNOSIS — Z6834 Body mass index (BMI) 34.0-34.9, adult: Secondary | ICD-10-CM

## 2019-07-23 DIAGNOSIS — E559 Vitamin D deficiency, unspecified: Secondary | ICD-10-CM | POA: Diagnosis not present

## 2019-07-23 MED ORDER — VITAMIN D (ERGOCALCIFEROL) 1.25 MG (50000 UNIT) PO CAPS: 50000.0000 [IU] | ORAL_CAPSULE | ORAL | 0 refills | Status: DC

## 2019-07-23 NOTE — Progress Notes (Signed)
Chief Complaint:   [name] is here to discuss her progress with her obesity treatment plan along with follow-up of her obesity related diagnoses. [name] is on the Category 3 Plan and the [place] + 300 calories and states she is following her eating plan approximately 90% of the time. [name] states she is working with a trainer 45 minutes 2 times per week.  Today's visit was #: 3 Starting weight: 219 lbs Starting date: 06/19/2019 Today's weight: 213 lbs Today's date: 07/23/2019 Total lbs lost to date: 6 Total lbs lost since last in-office visit: 5  Interim History: [name] has done the Category 3 meal plan mostly and has had to break up meat. She denies hunger. She has not eaten out or eaten with other people. Her son is coming to visit for a few days and may eat differently, but mostly only at dinner.  Subjective:   Vitamin D deficiency. No nausea, vomiting, or muscle weakness, but she endorses fatigue. She is on prescription Vitamin D supplementation. Last Vitamin D 32.5 on 06/19/2019.  Other hyperlipidemia. [name] is not on a statin.   Lab Results  Component Value Date   CHOL 252 (H) 06/19/2019   HDL 52 06/19/2019   LDLCALC 162 (H) 06/19/2019   LDLDIRECT 143.0 11/19/2018   TRIG 207 (H) 06/19/2019   CHOLHDL 6 11/19/2018   Lab Results  Component Value Date   ALT 15 06/19/2019   AST 25 06/19/2019   ALKPHOS 100 06/19/2019   BILITOT 0.4 06/19/2019   The 10-year ASCVD risk score [name], et al., 2013) is: 14.2%   Values used to calculate the score:     Age: 74 years     Sex: Female     Is Non-Hispanic African American: No     Diabetic: No     Tobacco smoker: No     Systolic Blood Pressure: 485 mmHg     Is BP treated: No     HDL Cholesterol: 52 mg/dL     Total Cholesterol: 252 mg/dL  Assessment/Plan:   Vitamin D deficiency. Low Vitamin D level contributes to fatigue and are associated with obesity, breast, and colon cancer. She was given a  refill on her Vitamin D, Ergocalciferol, (DRISDOL) 1.25 MG (50000 UNIT) CAPS capsule every week #4 with 0 refills and will follow-up for routine testing of Vitamin D, at least 2-3 times per year to avoid over-replacement.   Other hyperlipidemia. Cardiovascular risk and specific lipid/LDL goals reviewed.  We discussed several lifestyle modifications today and [name] will continue to work on diet, exercise and weight loss efforts. Orders and follow up as documented in patient record. [name] will have repeat labs in 3 months.  Counseling Intensive lifestyle modifications are the first line treatment for this issue. . Dietary changes: Increase soluble fiber. Decrease simple carbohydrates. . Exercise changes: Moderate to vigorous-intensity aerobic activity 150 minutes per week if tolerated. . Lipid-lowering medications: see documented in medical record.  Class 1 obesity with serious comorbidity and body mass index (BMI) of 34.0 to 34.9 in adult, unspecified obesity type.  [name] is currently in the action stage of change. As such, her goal is to continue with weight loss efforts. She has agreed to the Category 3 Plan and the [place] + 300 calories.   Exercise goals: [name] will continue her current exercise regimen.  Behavioral modification strategies: increasing lean protein intake, increasing vegetables, meal planning and cooking strategies and planning for success.  [name] has agreed to follow-up with our clinic in 2 weeks. She was informed of the importance of frequent follow-up visits to maximize her success with intensive lifestyle modifications for her multiple health conditions.   Objective:   Blood pressure 138/87, pulse 71, temperature 97.9 F (36.6 C), temperature source Oral, height 5\' 6"  (1.676 m), weight 213 lb (96.6 kg), SpO2 98 %. Body mass index is 34.38 kg/m.  General: Cooperative, alert, well developed, in no acute distress. HEENT: Conjunctivae and lids  unremarkable. Cardiovascular: Regular rhythm.  Lungs: Normal work of breathing. Neurologic: No focal deficits.   Lab Results  Component Value Date   CREATININE 0.91 06/19/2019   BUN 16 06/19/2019   NA 141 06/19/2019   K 4.4 06/19/2019   CL 104 06/19/2019   CO2 23 06/19/2019   Lab Results  Component Value Date   ALT 15 06/19/2019   AST 25 06/19/2019   ALKPHOS 100 06/19/2019   BILITOT 0.4 06/19/2019   No results found for: HGBA1C No results found for: INSULIN Lab Results  Component Value Date   TSH 1.220 06/19/2019   Lab Results  Component Value Date   CHOL 252 (H) 06/19/2019   HDL 52 06/19/2019   LDLCALC 162 (H) 06/19/2019   LDLDIRECT 143.0 11/19/2018   TRIG 207 (H) 06/19/2019   CHOLHDL 6 11/19/2018   Lab Results  Component Value Date   WBC 8.7 06/19/2019   HGB 15.5 06/19/2019   HCT 47.5 (H) 06/19/2019   MCV 93 06/19/2019   PLT 276 06/19/2019   No results found for: IRON, TIBC, FERRITIN  Obesity Behavioral Intervention Documentation for Insurance:   Approximately 15 minutes were spent on the discussion below.  ASK: We discussed the diagnosis of obesity with [name] today and [name] agreed to give [place] permission to discuss obesity behavioral modification therapy today.  ASSESS: [name] has the diagnosis of obesity and her BMI today is 34.5. [name] is in the action stage of change.   ADVISE: [name] was educated on the multiple health risks of obesity as well as the benefit of weight loss to improve her health. She was advised of the need for long term treatment and the importance of lifestyle modifications to improve her current health and to decrease her risk of future health problems.  AGREE: Multiple dietary modification options and treatment options were discussed and [name] agreed to follow the recommendations documented in the above note.  ARRANGE: [name] was educated on the importance of frequent visits to treat obesity as outlined per CMS and USPSTF  guidelines and agreed to schedule her next follow up appointment today.  Attestation Statements:   Reviewed by clinician on day of visit: allergies, medications, problem list, medical history, surgical history, family history, social history, and previous encounter notes.  I, Michaelene Song, am acting as transcriptionist for Coralie Common, MD   I have reviewed the above documentation for accuracy and completeness, and I agree with the above. - Jinny Blossom, MD

## 2019-07-25 ENCOUNTER — Other Ambulatory Visit (INDEPENDENT_AMBULATORY_CARE_PROVIDER_SITE_OTHER): Payer: Self-pay | Admitting: Family Medicine

## 2019-07-25 ENCOUNTER — Encounter (INDEPENDENT_AMBULATORY_CARE_PROVIDER_SITE_OTHER): Payer: Self-pay | Admitting: Family Medicine

## 2019-07-25 DIAGNOSIS — E559 Vitamin D deficiency, unspecified: Secondary | ICD-10-CM

## 2019-08-13 ENCOUNTER — Ambulatory Visit (INDEPENDENT_AMBULATORY_CARE_PROVIDER_SITE_OTHER): Payer: Medicare PPO | Admitting: Physician Assistant

## 2019-08-13 ENCOUNTER — Other Ambulatory Visit: Payer: Self-pay

## 2019-08-13 ENCOUNTER — Encounter (INDEPENDENT_AMBULATORY_CARE_PROVIDER_SITE_OTHER): Payer: Self-pay | Admitting: Physician Assistant

## 2019-08-13 VITALS — BP 126/80 | HR 68 | Temp 98.2°F | Ht 66.0 in | Wt 212.0 lb

## 2019-08-13 DIAGNOSIS — E669 Obesity, unspecified: Secondary | ICD-10-CM

## 2019-08-13 DIAGNOSIS — E559 Vitamin D deficiency, unspecified: Secondary | ICD-10-CM

## 2019-08-13 DIAGNOSIS — Z6834 Body mass index (BMI) 34.0-34.9, adult: Secondary | ICD-10-CM

## 2019-08-13 DIAGNOSIS — E7849 Other hyperlipidemia: Secondary | ICD-10-CM

## 2019-08-13 MED ORDER — VITAMIN D (ERGOCALCIFEROL) 1.25 MG (50000 UNIT) PO CAPS: 50000.0000 [IU] | ORAL_CAPSULE | ORAL | 0 refills | Status: DC

## 2019-08-13 NOTE — Progress Notes (Signed)
Chief Complaint:   [name] is here to discuss her progress with her obesity treatment plan along with follow-up of her obesity related diagnoses. [name] is on the Category 3 Plan and the [place] + 300 calories. She states she is following her eating plan approximately 75% of the time. [name] states she is strength training 45 minutes 2 times per week.  Today's visit was #: 4 Starting weight: 219 lbs Starting date: 06/19/2019 Today's weight: 212 lbs Today's date: 08/13/2019 Total lbs lost to date: 7 Total lbs lost since last in-office visit: 1  Interim History: [name] reports that her son has been staying with her the last 3 weeks and she has not been eating completely on plan. He left today and she is ready to get back on track. She is also journaling.   Subjective:   Vitamin D deficiency. No nausea, vomiting, or muscle weakness on prescription Vitamin D supplementation.    Ref. Range 06/19/2019 11:51  Vitamin D, 25-Hydroxy Latest Ref Range: 30.0 - 100.0 ng/mL 32.5   Other hyperlipidemia. [name] is on no medication. No chest pain.  Lab Results  Component Value Date   CHOL 252 (H) 06/19/2019   HDL 52 06/19/2019   LDLCALC 162 (H) 06/19/2019   LDLDIRECT 143.0 11/19/2018   TRIG 207 (H) 06/19/2019   CHOLHDL 6 11/19/2018   Lab Results  Component Value Date   ALT 15 06/19/2019   AST 25 06/19/2019   ALKPHOS 100 06/19/2019   BILITOT 0.4 06/19/2019   The 10-year ASCVD risk score Shelby Bussing DC Jr., et al., 2013) is: 12%   Values used to calculate the score:     Age: 74 years     Sex: Female     Is Non-Hispanic African American: No     Diabetic: No     Tobacco smoker: No     Systolic Blood Pressure: 924 mmHg     Is BP treated: No     HDL Cholesterol: 52 mg/dL     Total Cholesterol: 252 mg/dL  Assessment/Plan:   Vitamin D deficiency. Low Vitamin D level contributes to fatigue and are associated with obesity, breast, and colon cancer. She was given a refill  on her Vitamin D, Ergocalciferol, (DRISDOL) 1.25 MG (50000 UNIT) CAPS capsule every week #4 with 0 refills and will follow-up for routine testing of Vitamin D, at least 2-3 times per year to avoid over-replacement.   Other hyperlipidemia. Cardiovascular risk and specific lipid/LDL goals reviewed.  We discussed several lifestyle modifications today and [name] will continue to work on diet, exercise and weight loss efforts. Orders and follow up as documented in patient record.   Counseling Intensive lifestyle modifications are the first line treatment for this issue.  Dietary changes: Increase soluble fiber. Decrease simple carbohydrates.  Exercise changes: Moderate to vigorous-intensity aerobic activity 150 minutes per week if tolerated.  Lipid-lowering medications: see documented in medical record.  Class 1 obesity with serious comorbidity and body mass index (BMI) of 34.0 to 34.9 in adult, unspecified obesity type.  [name] is currently in the action stage of change. As such, her goal is to continue with weight loss efforts. She has agreed to the Category 3 Plan and the [place] +300 calories.   Exercise goals: Older adults should follow the adult guidelines. When older adults cannot meet the adult guidelines, they should be as physically active as their abilities and conditions will allow.   Behavioral modification strategies: increasing lean protein  intake and meal planning and cooking strategies.  [name] has agreed to follow-up with our clinic in 2-3 weeks. She was informed of the importance of frequent follow-up visits to maximize her success with intensive lifestyle modifications for her multiple health conditions.   Objective:   Blood pressure 126/80, pulse 68, temperature 98.2 F (36.8 C), temperature source Oral, height 5\' 6"  (1.676 m), weight 212 lb (96.2 kg), SpO2 97 %. Body mass index is 34.22 kg/m.  General: Cooperative, alert, well developed, in no acute  distress. HEENT: Conjunctivae and lids unremarkable. Cardiovascular: Regular rhythm.  Lungs: Normal work of breathing. Neurologic: No focal deficits.   Lab Results  Component Value Date   CREATININE 0.91 06/19/2019   BUN 16 06/19/2019   NA 141 06/19/2019   K 4.4 06/19/2019   CL 104 06/19/2019   CO2 23 06/19/2019   Lab Results  Component Value Date   ALT 15 06/19/2019   AST 25 06/19/2019   ALKPHOS 100 06/19/2019   BILITOT 0.4 06/19/2019   No results found for: HGBA1C No results found for: INSULIN Lab Results  Component Value Date   TSH 1.220 06/19/2019   Lab Results  Component Value Date   CHOL 252 (H) 06/19/2019   HDL 52 06/19/2019   LDLCALC 162 (H) 06/19/2019   LDLDIRECT 143.0 11/19/2018   TRIG 207 (H) 06/19/2019   CHOLHDL 6 11/19/2018   Lab Results  Component Value Date   WBC 8.7 06/19/2019   HGB 15.5 06/19/2019   HCT 47.5 (H) 06/19/2019   MCV 93 06/19/2019   PLT 276 06/19/2019   No results found for: IRON, TIBC, FERRITIN  Obesity Behavioral Intervention Documentation for Insurance:   Approximately 15 minutes were spent on the discussion below.  ASK: We discussed the diagnosis of obesity with [name] today and [name] agreed to give [place] permission to discuss obesity behavioral modification therapy today.  ASSESS: [name] has the diagnosis of obesity and her BMI today is 34.3. [name] is in the action stage of change.   ADVISE: [name] was educated on the multiple health risks of obesity as well as the benefit of weight loss to improve her health. She was advised of the need for long term treatment and the importance of lifestyle modifications to improve her current health and to decrease her risk of future health problems.  AGREE: Multiple dietary modification options and treatment options were discussed and [name] agreed to follow the recommendations documented in the above note.  ARRANGE: [name] was educated on the importance of frequent visits to treat obesity  as outlined per CMS and USPSTF guidelines and agreed to schedule her next follow up appointment today.  Attestation Statements:   Reviewed by clinician on day of visit: allergies, medications, problem list, medical history, surgical history, family history, social history, and previous encounter notes.  IMichaelene Song, am acting as transcriptionist for Abby Potash, PA-C   I have reviewed the above documentation for accuracy and completeness, and I agree with the above. Abby Potash, PA-C

## 2019-08-15 ENCOUNTER — Other Ambulatory Visit (INDEPENDENT_AMBULATORY_CARE_PROVIDER_SITE_OTHER): Payer: Self-pay | Admitting: Physician Assistant

## 2019-08-15 DIAGNOSIS — E559 Vitamin D deficiency, unspecified: Secondary | ICD-10-CM

## 2019-08-27 ENCOUNTER — Other Ambulatory Visit: Payer: Self-pay

## 2019-08-27 ENCOUNTER — Ambulatory Visit (INDEPENDENT_AMBULATORY_CARE_PROVIDER_SITE_OTHER): Payer: Medicare PPO | Admitting: Family Medicine

## 2019-08-27 ENCOUNTER — Encounter (INDEPENDENT_AMBULATORY_CARE_PROVIDER_SITE_OTHER): Payer: Self-pay | Admitting: Family Medicine

## 2019-08-27 VITALS — BP 112/76 | HR 68 | Temp 98.0°F | Ht 66.0 in | Wt 208.0 lb

## 2019-08-27 DIAGNOSIS — Z6833 Body mass index (BMI) 33.0-33.9, adult: Secondary | ICD-10-CM | POA: Diagnosis not present

## 2019-08-27 DIAGNOSIS — E7849 Other hyperlipidemia: Secondary | ICD-10-CM | POA: Diagnosis not present

## 2019-08-27 DIAGNOSIS — E66811 Obesity, class 1: Secondary | ICD-10-CM

## 2019-08-27 DIAGNOSIS — E559 Vitamin D deficiency, unspecified: Secondary | ICD-10-CM

## 2019-08-27 DIAGNOSIS — E669 Obesity, unspecified: Secondary | ICD-10-CM | POA: Diagnosis not present

## 2019-09-02 NOTE — Progress Notes (Signed)
Chief Complaint:   OBESITY [name] is here to discuss her progress with her obesity treatment plan along with follow-up of her obesity related diagnoses. [name] is on the Category 3 Plan or the [place] + 300 calories and states she is following her eating plan approximately 75-80% of the time. [name] states she is weight training for 45 minutes 2 times per week, and walking 5,000 steps 7 times per week.  Today's visit was #: 5 Starting weight: 219 lbs Starting date: 06/19/2019 Today's weight: 208 lbs Today's date: 08/27/2019 Total lbs lost to date: 11 Total lbs lost since last in-office visit: 4  Interim History: [name] felt the last few weeks were more stable and on the plan than previously. She is only thing she is eating off the plan is fruit that is in season. Exercise is about the same. She is able to get almost everything in for protein except occasionally at lunch. She is doing [employer] for snacks.  Subjective:   1. Other hyperlipidemia [name]'s last LDL was 162, HDL 52, triglycerides 207. She is not on statin.  2. Vitamin D deficiency Bernardette's last Vit D level was 32.5. She notes fatigue, and denies nausea, vomiting, or muscle weakness.  Assessment/Plan:   1. Other hyperlipidemia Cardiovascular risk and specific lipid/LDL goals reviewed. We discussed several lifestyle modifications today and [name] will continue to work on diet, exercise and weight loss efforts. We will follow up on labs in early September. Orders and follow up as documented in patient record.   Counseling Intensive lifestyle modifications are the first line treatment for this issue. . Dietary changes: Increase soluble fiber. Decrease simple carbohydrates. . Exercise changes: Moderate to vigorous-intensity aerobic activity 150 minutes per week if tolerated. . Lipid-lowering medications: see documented in medical record.  2. Vitamin D deficiency Low Vitamin D level contributes to fatigue and are  associated with obesity, breast, and colon cancer. [name] agreed to continue taking prescription Vitamin D 50,000 IU every week, no refill needed. She will follow-up for routine testing of Vitamin D, at least 2-3 times per year to avoid over-replacement.  3. Class 1 obesity with serious comorbidity and body mass index (BMI) of 33.0 to 33.9 in adult, unspecified obesity type [name] is currently in the action stage of change. As such, her goal is to continue with weight loss efforts. She has agreed to the Category 3 Plan.   Exercise goals: As is.  Behavioral modification strategies: increasing lean protein intake, meal planning and cooking strategies and keeping healthy foods in the home.  [name] has agreed to follow-up with our clinic in 2 to 3 weeks. She was informed of the importance of frequent follow-up visits to maximize her success with intensive lifestyle modifications for her multiple health conditions.   Objective:   Blood pressure 112/76, pulse 68, temperature 98 F (36.7 C), temperature source Oral, height 5\' 6"  (1.676 m), weight 208 lb (94.3 kg), SpO2 97 %. Body mass index is 33.57 kg/m.  General: Cooperative, alert, well developed, in no acute distress. HEENT: Conjunctivae and lids unremarkable. Cardiovascular: Regular rhythm.  Lungs: Normal work of breathing. Neurologic: No focal deficits.   Lab Results  Component Value Date   CREATININE 0.91 06/19/2019   BUN 16 06/19/2019   NA 141 06/19/2019   K 4.4 06/19/2019   CL 104 06/19/2019   CO2 23 06/19/2019   Lab Results  Component Value Date   ALT 15 06/19/2019   AST 25 06/19/2019  ALKPHOS 100 06/19/2019   BILITOT 0.4 06/19/2019   No results found for: HGBA1C No results found for: INSULIN Lab Results  Component Value Date   TSH 1.220 06/19/2019   Lab Results  Component Value Date   CHOL 252 (H) 06/19/2019   HDL 52 06/19/2019   LDLCALC 162 (H) 06/19/2019   LDLDIRECT 143.0 11/19/2018   TRIG 207 (H) 06/19/2019    CHOLHDL 6 11/19/2018   Lab Results  Component Value Date   WBC 8.7 06/19/2019   HGB 15.5 06/19/2019   HCT 47.5 (H) 06/19/2019   MCV 93 06/19/2019   PLT 276 06/19/2019   No results found for: IRON, TIBC, FERRITIN  Attestation Statements:   Reviewed by clinician on day of visit: allergies, medications, problem list, medical history, surgical history, family history, social history, and previous encounter notes.  Time spent on visit including pre-visit chart review and post-visit care and charting was 17 minutes.    I, Trixie Dredge, am acting as transcriptionist for Coralie Common, MD.  I have reviewed the above documentation for accuracy and completeness, and I agree with the above. - Jinny Blossom, MD

## 2019-09-18 ENCOUNTER — Other Ambulatory Visit: Payer: Self-pay

## 2019-09-18 ENCOUNTER — Encounter (INDEPENDENT_AMBULATORY_CARE_PROVIDER_SITE_OTHER): Payer: Self-pay | Admitting: Family Medicine

## 2019-09-18 ENCOUNTER — Other Ambulatory Visit (INDEPENDENT_AMBULATORY_CARE_PROVIDER_SITE_OTHER): Payer: Self-pay | Admitting: Family Medicine

## 2019-09-18 ENCOUNTER — Ambulatory Visit (INDEPENDENT_AMBULATORY_CARE_PROVIDER_SITE_OTHER): Payer: Medicare PPO | Admitting: Family Medicine

## 2019-09-18 VITALS — BP 130/85 | HR 73 | Temp 98.1°F | Ht 66.0 in | Wt 206.0 lb

## 2019-09-18 DIAGNOSIS — Z6833 Body mass index (BMI) 33.0-33.9, adult: Secondary | ICD-10-CM

## 2019-09-18 DIAGNOSIS — E559 Vitamin D deficiency, unspecified: Secondary | ICD-10-CM

## 2019-09-18 DIAGNOSIS — E669 Obesity, unspecified: Secondary | ICD-10-CM | POA: Diagnosis not present

## 2019-09-18 DIAGNOSIS — E7849 Other hyperlipidemia: Secondary | ICD-10-CM

## 2019-09-18 DIAGNOSIS — E66811 Obesity, class 1: Secondary | ICD-10-CM

## 2019-09-18 MED ORDER — VITAMIN D (ERGOCALCIFEROL) 1.25 MG (50000 UNIT) PO CAPS: 50000.0000 [IU] | ORAL_CAPSULE | ORAL | 0 refills | Status: DC

## 2019-09-19 NOTE — Progress Notes (Signed)
Chief Complaint:   OBESITY Shanell is here to discuss her progress with her obesity treatment plan along with follow-up of her obesity related diagnoses. Alaiya is on the Category 3 Plan and states she is following her eating plan approximately 75% of the time. Alysia states she is exercising with a trainer for 45 minutes 2 times per week, and walking for 15-20 minutes 7 times per week.  Today's visit was #: 6 Starting weight: 219 lbs Starting date: 06/19/2019 Today's weight: 206 lbs Today's date: 09/18/2019 Total lbs lost to date: 13 Total lbs lost since last in-office visit: 2  Interim History: Blenda had a birthday at the end of July and spent the day with her sister. She is doing quite a bit more fruit secondary to Summer. She has gone out to lunch a few times over the last few weeks. She is seeing people the next few weeks so she is doing more lunches out.  Subjective:   1. Vitamin D deficiency Georjean denies nausea, vomiting, or muscle weakness, but she notes fatigue. She is on Vit D prescription.  2. Other hyperlipidemia Camari's last LDL was 162, HDL 52, and triglycerides 207. She is not on statin.  Assessment/Plan:   1. Vitamin D deficiency Low Vitamin D level contributes to fatigue and are associated with obesity, breast, and colon cancer. We will refill prescription Vitamin D for 1 month. Aubrii will follow-up for routine testing of Vitamin D, at least 2-3 times per year to avoid over-replacement.  - Vitamin D, Ergocalciferol, (DRISDOL) 1.25 MG (50000 UNIT) CAPS capsule; Take 1 capsule (50,000 Units total) by mouth every 7 (seven) days.  Dispense: 4 capsule; Refill: 0  2. Other hyperlipidemia Cardiovascular risk and specific lipid/LDL goals reviewed. We discussed several lifestyle modifications today and Laterra will continue to work on diet, exercise and weight loss efforts. We will repeat labs in early September. Orders and follow up as documented in patient record.    Counseling Intensive lifestyle modifications are the first line treatment for this issue. . Dietary changes: Increase soluble fiber. Decrease simple carbohydrates. . Exercise changes: Moderate to vigorous-intensity aerobic activity 150 minutes per week if tolerated. . Lipid-lowering medications: see documented in medical record.  3. Class 1 obesity with serious comorbidity and body mass index (BMI) of 33.0 to 33.9 in adult, unspecified obesity type Sandi is currently in the action stage of change. As such, her goal is to continue with weight loss efforts. She has agreed to the Category 3 Plan.   Exercise goals: As is.  Behavioral modification strategies: increasing lean protein intake, increasing vegetables, meal planning and cooking strategies, keeping healthy foods in the home and planning for success.  Cesilia has agreed to follow-up with our clinic in 3 weeks. She was informed of the importance of frequent follow-up visits to maximize her success with intensive lifestyle modifications for her multiple health conditions.   Objective:   Blood pressure 130/85, pulse 73, temperature 98.1 F (36.7 C), temperature source Oral, height 5\' 6"  (1.676 m), weight 206 lb (93.4 kg), SpO2 98 %. Body mass index is 33.25 kg/m.  General: Cooperative, alert, well developed, in no acute distress. HEENT: Conjunctivae and lids unremarkable. Cardiovascular: Regular rhythm.  Lungs: Normal work of breathing. Neurologic: No focal deficits.   Lab Results  Component Value Date   CREATININE 0.91 06/19/2019   BUN 16 06/19/2019   NA 141 06/19/2019   K 4.4 06/19/2019   CL 104 06/19/2019   CO2 23  06/19/2019   Lab Results  Component Value Date   ALT 15 06/19/2019   AST 25 06/19/2019   ALKPHOS 100 06/19/2019   BILITOT 0.4 06/19/2019   No results found for: HGBA1C No results found for: INSULIN Lab Results  Component Value Date   TSH 1.220 06/19/2019   Lab Results  Component Value Date   CHOL  252 (H) 06/19/2019   HDL 52 06/19/2019   LDLCALC 162 (H) 06/19/2019   LDLDIRECT 143.0 11/19/2018   TRIG 207 (H) 06/19/2019   CHOLHDL 6 11/19/2018   Lab Results  Component Value Date   WBC 8.7 06/19/2019   HGB 15.5 06/19/2019   HCT 47.5 (H) 06/19/2019   MCV 93 06/19/2019   PLT 276 06/19/2019   No results found for: IRON, TIBC, FERRITIN  Obesity Behavioral Intervention Documentation for Insurance:   Approximately 15 minutes were spent on the discussion below.  ASK: We discussed the diagnosis of obesity with [name] today and Beanca agreed to give [place] permission to discuss obesity behavioral modification therapy today.  ASSESS: Reyes has the diagnosis of obesity and her BMI today is 33.27. Shulamis is in the action stage of change.   ADVISE: Jennfer was educated on the multiple health risks of obesity as well as the benefit of weight loss to improve her health. She was advised of the need for long term treatment and the importance of lifestyle modifications to improve her current health and to decrease her risk of future health problems.  AGREE: Multiple dietary modification options and treatment options were discussed and Yasmina agreed to follow the recommendations documented in the above note.  ARRANGE: Zakeya was educated on the importance of frequent visits to treat obesity as outlined per CMS and USPSTF guidelines and agreed to schedule her next follow up appointment today.  Attestation Statements:   Reviewed by clinician on day of visit: allergies, medications, problem list, medical history, surgical history, family history, social history, and previous encounter notes.   I, Trixie Dredge, am acting as transcriptionist for Coralie Common, MD. I have reviewed the above documentation for accuracy and completeness, and I agree with the above. - Jinny Blossom, MD

## 2019-10-03 DIAGNOSIS — L719 Rosacea, unspecified: Secondary | ICD-10-CM | POA: Diagnosis not present

## 2019-10-09 ENCOUNTER — Other Ambulatory Visit (INDEPENDENT_AMBULATORY_CARE_PROVIDER_SITE_OTHER): Payer: Self-pay | Admitting: Family Medicine

## 2019-10-09 ENCOUNTER — Ambulatory Visit (INDEPENDENT_AMBULATORY_CARE_PROVIDER_SITE_OTHER): Payer: Medicare PPO | Admitting: Family Medicine

## 2019-10-09 ENCOUNTER — Other Ambulatory Visit: Payer: Self-pay

## 2019-10-09 ENCOUNTER — Encounter (INDEPENDENT_AMBULATORY_CARE_PROVIDER_SITE_OTHER): Payer: Self-pay | Admitting: Family Medicine

## 2019-10-09 VITALS — BP 129/84 | HR 66 | Temp 97.8°F | Ht 66.0 in | Wt 204.0 lb

## 2019-10-09 DIAGNOSIS — E559 Vitamin D deficiency, unspecified: Secondary | ICD-10-CM

## 2019-10-09 DIAGNOSIS — E669 Obesity, unspecified: Secondary | ICD-10-CM

## 2019-10-09 DIAGNOSIS — E7849 Other hyperlipidemia: Secondary | ICD-10-CM

## 2019-10-09 DIAGNOSIS — Z6833 Body mass index (BMI) 33.0-33.9, adult: Secondary | ICD-10-CM | POA: Diagnosis not present

## 2019-10-09 MED ORDER — VITAMIN D (ERGOCALCIFEROL) 1.25 MG (50000 UNIT) PO CAPS: 50000.0000 [IU] | ORAL_CAPSULE | ORAL | 0 refills | Status: DC

## 2019-10-09 NOTE — Progress Notes (Signed)
Chief Complaint:   OBESITY Lockie is here to discuss her progress with her obesity treatment plan along with follow-up of her obesity related diagnoses. Shamya is on the Category 3 Plan and states she is following her eating plan approximately 90% of the time. Shalae states she is exercising with a trainer for 45 minutes 2 times per week.  Today's visit was #: 7 Starting weight: 219 lbs Starting date: 06/19/2019 Today's weight: 204 lbs Today's date: 10/09/2019 Total lbs lost to date: 15 Total lbs lost since last in-office visit: 2  Interim History: Korene is not feeling hungry but she is occasionally feeling bored with her meal plan. She has revamped the meal plan a few times to increase variety. She finds dinner to be the hardiest to be creative and tends to air on simplicity. She tends to be eating more salmon and she eats a little later. She is eating about 25-27 grams.  Subjective:   1. Vitamin D deficiency Shawndrea denies nausea, vomiting, or muscle weakness, but she notes fatigue. She is on prescription Vit D. Last Vit D level was 32.5.  2. Other hyperlipidemia Chairty's last LDL was slightly elevated in May 2021. She is not on medications.  Assessment/Plan:   1. Vitamin D deficiency Low Vitamin D level contributes to fatigue and are associated with obesity, breast, and colon cancer. We will refill prescription Vitamin D for 1 month. Oluwasemilore will follow-up for routine testing of Vitamin D, at least 2-3 times per year to avoid over-replacement.  - Vitamin D, Ergocalciferol, (DRISDOL) 1.25 MG (50000 UNIT) CAPS capsule; Take 1 capsule (50,000 Units total) by mouth every 7 (seven) days.  Dispense: 4 capsule; Refill: 0  2. Other hyperlipidemia Cardiovascular risk and specific lipid/LDL goals reviewed. We discussed several lifestyle modifications today and Lexa will continue to work on diet, exercise and weight loss efforts. We will repeat labs in 1 month. Orders and follow up as documented  in patient record.   Counseling Intensive lifestyle modifications are the first line treatment for this issue. . Dietary changes: Increase soluble fiber. Decrease simple carbohydrates. . Exercise changes: Moderate to vigorous-intensity aerobic activity 150 minutes per week if tolerated. . Lipid-lowering medications: see documented in medical record.  3. Class 1 obesity with serious comorbidity and body mass index (BMI) of 33.0 to 33.9 in adult, unspecified obesity type Jaionna is currently in the action stage of change. As such, her goal is to continue with weight loss efforts. She has agreed to the Category 3 Plan.   Exercise goals: All adults should avoid inactivity. Some physical activity is better than none, and adults who participate in any amount of physical activity gain some health benefits.  Behavioral modification strategies: increasing lean protein intake, increasing vegetables, meal planning and cooking strategies, keeping healthy foods in the home and planning for success.  Dorrine has agreed to follow-up with our clinic in 2 weeks. She was informed of the importance of frequent follow-up visits to maximize her success with intensive lifestyle modifications for her multiple health conditions.   Objective:   Blood pressure 129/84, pulse 66, temperature 97.8 F (36.6 C), temperature source Oral, height 5\' 6"  (1.676 m), weight 204 lb (92.5 kg), SpO2 98 %. Body mass index is 32.93 kg/m.  General: Cooperative, alert, well developed, in no acute distress. HEENT: Conjunctivae and lids unremarkable. Cardiovascular: Regular rhythm.  Lungs: Normal work of breathing. Neurologic: No focal deficits.   Lab Results  Component Value Date   CREATININE  0.91 06/19/2019   BUN 16 06/19/2019   NA 141 06/19/2019   K 4.4 06/19/2019   CL 104 06/19/2019   CO2 23 06/19/2019   Lab Results  Component Value Date   ALT 15 06/19/2019   AST 25 06/19/2019   ALKPHOS 100 06/19/2019   BILITOT 0.4  06/19/2019   No results found for: HGBA1C No results found for: INSULIN Lab Results  Component Value Date   TSH 1.220 06/19/2019   Lab Results  Component Value Date   CHOL 252 (H) 06/19/2019   HDL 52 06/19/2019   LDLCALC 162 (H) 06/19/2019   LDLDIRECT 143.0 11/19/2018   TRIG 207 (H) 06/19/2019   CHOLHDL 6 11/19/2018   Lab Results  Component Value Date   WBC 8.7 06/19/2019   HGB 15.5 06/19/2019   HCT 47.5 (H) 06/19/2019   MCV 93 06/19/2019   PLT 276 06/19/2019   No results found for: IRON, TIBC, FERRITIN  Obesity Behavioral Intervention Documentation for Insurance:   Approximately 15 minutes were spent on the discussion below.  ASK: We discussed the diagnosis of obesity with [name] today and Ashna agreed to give [place] permission to discuss obesity behavioral modification therapy today.  ASSESS: Lynann has the diagnosis of obesity and her BMI today is 32.94. Jaylianna is in the action stage of change.   ADVISE: Ramyah was educated on the multiple health risks of obesity as well as the benefit of weight loss to improve her health. She was advised of the need for long term treatment and the importance of lifestyle modifications to improve her current health and to decrease her risk of future health problems.  AGREE: Multiple dietary modification options and treatment options were discussed and Sylvia agreed to follow the recommendations documented in the above note.  ARRANGE: Carlyon was educated on the importance of frequent visits to treat obesity as outlined per CMS and USPSTF guidelines and agreed to schedule her next follow up appointment today.  Attestation Statements:   Reviewed by clinician on day of visit: allergies, medications, problem list, medical history, surgical history, family history, social history, and previous encounter notes.   I, Trixie Dredge, am acting as transcriptionist for Coralie Common, MD.  I have reviewed the above documentation for accuracy and  completeness, and I agree with the above. - Jinny Blossom, MD

## 2019-10-30 ENCOUNTER — Ambulatory Visit (INDEPENDENT_AMBULATORY_CARE_PROVIDER_SITE_OTHER): Payer: Medicare PPO | Admitting: Family Medicine

## 2019-10-30 ENCOUNTER — Other Ambulatory Visit: Payer: Self-pay

## 2019-10-30 ENCOUNTER — Encounter (INDEPENDENT_AMBULATORY_CARE_PROVIDER_SITE_OTHER): Payer: Self-pay | Admitting: Family Medicine

## 2019-10-30 VITALS — BP 110/70 | HR 62 | Temp 97.7°F | Ht 66.0 in | Wt 204.0 lb

## 2019-10-30 DIAGNOSIS — Z6833 Body mass index (BMI) 33.0-33.9, adult: Secondary | ICD-10-CM | POA: Diagnosis not present

## 2019-10-30 DIAGNOSIS — E7849 Other hyperlipidemia: Secondary | ICD-10-CM

## 2019-10-30 DIAGNOSIS — E559 Vitamin D deficiency, unspecified: Secondary | ICD-10-CM

## 2019-10-30 DIAGNOSIS — E538 Deficiency of other specified B group vitamins: Secondary | ICD-10-CM

## 2019-10-30 DIAGNOSIS — E669 Obesity, unspecified: Secondary | ICD-10-CM

## 2019-10-31 LAB — LIPID PANEL WITH LDL/HDL RATIO
Cholesterol, Total: 221 mg/dL — ABNORMAL HIGH (ref 100–199)
HDL: 43 mg/dL (ref >39)
LDL Chol Calc (NIH): 145 mg/dL — ABNORMAL HIGH (ref 0–99)
LDL/HDL Ratio: 3.4 ratio — ABNORMAL HIGH (ref 0.0–3.2)
Triglycerides: 183 mg/dL — ABNORMAL HIGH (ref 0–149)
VLDL Cholesterol Cal: 33 mg/dL (ref 5–40)

## 2019-10-31 LAB — VITAMIN B12: Vitamin B-12: 1185 pg/mL (ref 232–1245)

## 2019-10-31 LAB — VITAMIN D 25 HYDROXY (VIT D DEFICIENCY, FRACTURES): Vit D, 25-Hydroxy: 40 ng/mL (ref 30.0–100.0)

## 2019-10-31 NOTE — Progress Notes (Signed)
Chief Complaint:   OBESITY [name] is here to discuss her progress with her obesity treatment plan along with follow-up of her obesity related diagnoses. [name] is on the Category 3 Plan and states she is following her eating plan approximately 80% of the time. [name] states she is exercising with a trainer for 45 minutes 2 times per week.  Today's visit was #: 8 Starting weight: 219 lbs Starting date: 06/19/2019 Today's weight: 204 lbs Today's date: 10/30/2019 Total lbs lost to date: 15 Total lbs lost since last in-office visit: 0  Interim History: [name] has been working out with a trainer a few days per week, and she has seen a few friends over the last few weeks. She has met with friends while on a screen porch. She is a fairly habitual eater and is occasionally eating an extra protein bar. Believes this is boredom eating.  Subjective:   1. Other hyperlipidemia [name]'s last LDL was 162, HDL 52, and triglycerides 207. She is not on statin.  2. Vitamin D deficiency [name] denies nausea, vomiting, or muscle weakness, but she notes fatigue. Last Vit D level was 32.5.  3. Vitamin B12 deficiency [name] is on oral B12 supplementation, and she notes fatigue.  Assessment/Plan:   1. Other hyperlipidemia Cardiovascular risk and specific lipid/LDL goals reviewed. We discussed several lifestyle modifications today. Kymiah will continue to work on diet, exercise and weight loss efforts. We will check labs today. Orders and follow up as documented in patient record.   Counseling Intensive lifestyle modifications are the first line treatment for this issue. . Dietary changes: Increase soluble fiber. Decrease simple carbohydrates. . Exercise changes: Moderate to vigorous-intensity aerobic activity 150 minutes per week if tolerated. . Lipid-lowering medications: see documented in medical record.  - Lipid Panel With LDL/HDL Ratio  2. Vitamin D deficiency Low Vitamin D level contributes to  fatigue and are associated with obesity, breast, and colon cancer. We will check labs today. [name] will follow-up for routine testing of Vitamin D, at least 2-3 times per year to avoid over-replacement.  - VITAMIN D 25 Hydroxy (Vit-D Deficiency, Fractures)  3. Vitamin B12 deficiency The diagnosis was reviewed with the patient. Counseling provided today, see below. We will continue to monitor. We will check labs today, and [name] will follow up as directed. Orders and follow up as documented in patient record.  Counseling . The body needs vitamin B12: to make red blood cells; to make DNA; and to help the nerves work properly so they can carry messages from the brain to the body.  . The main causes of vitamin B12 deficiency include dietary deficiency, digestive diseases, pernicious anemia, and having a surgery in which part of the stomach or small intestine is removed.  . Certain medicines can make it harder for the body to absorb vitamin B12. These medicines include: heartburn medications; some antibiotics; some medications used to treat diabetes, gout, and high cholesterol.  . In some cases, there are no symptoms of this condition. If the condition leads to anemia or nerve damage, various symptoms can occur, such as weakness or fatigue, shortness of breath, and numbness or tingling in your hands and feet.   . Treatment:  o May include taking vitamin B12 supplements.  o Avoid alcohol.  o Eat lots of healthy foods that contain vitamin B12: - Beef, pork, chicken, [place], and organ meats, such as liver.  - Seafood: This includes clams, rainbow trout, salmon, tuna, and haddock. Eggs.  -  Cereal and dairy products that are fortified: This means that vitamin B12 has been added to the food.   - Vitamin B12  4. Class 1 obesity with serious comorbidity and body mass index (BMI) of 33.0 to 33.9 in adult, unspecified obesity type [name] is currently in the action stage of change. As such, her goal is to  continue with weight loss efforts. She has agreed to the Category 3 Plan.   [name] is to start planning how to break up food to get all of the food in over a longer time span.  Exercise goals: As is, and add in 2 additional days of activity per week.  Behavioral modification strategies: increasing lean protein intake, meal planning and cooking strategies, keeping healthy foods in the home and planning for success.  [name] has agreed to follow-up with our clinic in 2 to 3 weeks. She was informed of the importance of frequent follow-up visits to maximize her success with intensive lifestyle modifications for her multiple health conditions.   [name] was informed we would discuss her lab results at her next visit unless there is a critical issue that needs to be addressed sooner. [name] agreed to keep her next visit at the agreed upon time to discuss these results.  Objective:   Blood pressure 110/70, pulse 62, temperature 97.7 F (36.5 C), temperature source Oral, height '5\' 6"'  (1.676 m), weight 204 lb (92.5 kg), SpO2 97 %. Body mass index is 32.93 kg/m.  General: Cooperative, alert, well developed, in no acute distress. HEENT: Conjunctivae and lids unremarkable. Cardiovascular: Regular rhythm.  Lungs: Normal work of breathing. Neurologic: No focal deficits.   Lab Results  Component Value Date   CREATININE 0.91 06/19/2019   BUN 16 06/19/2019   NA 141 06/19/2019   K 4.4 06/19/2019   CL 104 06/19/2019   CO2 23 06/19/2019   Lab Results  Component Value Date   ALT 15 06/19/2019   AST 25 06/19/2019   ALKPHOS 100 06/19/2019   BILITOT 0.4 06/19/2019   No results found for: HGBA1C No results found for: INSULIN Lab Results  Component Value Date   TSH 1.220 06/19/2019   Lab Results  Component Value Date   CHOL 221 (H) 10/30/2019   HDL 43 10/30/2019   LDLCALC 145 (H) 10/30/2019   LDLDIRECT 143.0 11/19/2018   TRIG 183 (H) 10/30/2019   CHOLHDL 6 11/19/2018   Lab Results  Component  Value Date   WBC 8.7 06/19/2019   HGB 15.5 06/19/2019   HCT 47.5 (H) 06/19/2019   MCV 93 06/19/2019   PLT 276 06/19/2019   No results found for: IRON, TIBC, FERRITIN  Obesity Behavioral Intervention:   Approximately 15 minutes were spent on the discussion below.  ASK: We discussed the diagnosis of obesity with [name] today and [name] agreed to give [place] permission to discuss obesity behavioral modification therapy today.  ASSESS: [name] has the diagnosis of obesity and her BMI today is 32.94. [name] is in the action stage of change.   ADVISE: [name] was educated on the multiple health risks of obesity as well as the benefit of weight loss to improve her health. She was advised of the need for long term treatment and the importance of lifestyle modifications to improve her current health and to decrease her risk of future health problems.  AGREE: Multiple dietary modification options and treatment options were discussed and [name] agreed to follow the recommendations documented in the above note.  ARRANGE: [name] was educated on  the importance of frequent visits to treat obesity as outlined per CMS and USPSTF guidelines and agreed to schedule her next follow up appointment today.  Attestation Statements:   Reviewed by clinician on day of visit: allergies, medications, problem list, medical history, surgical history, family history, social history, and previous encounter notes.   I, [name], am acting as transcriptionist for Coralie Common, MD.  I have reviewed the above documentation for accuracy and completeness, and I agree with the above. - Shelby Blossom, MD

## 2019-11-11 DIAGNOSIS — H25813 Combined forms of age-related cataract, bilateral: Secondary | ICD-10-CM | POA: Diagnosis not present

## 2019-11-11 DIAGNOSIS — H401131 Primary open-angle glaucoma, bilateral, mild stage: Secondary | ICD-10-CM | POA: Diagnosis not present

## 2019-11-13 ENCOUNTER — Other Ambulatory Visit: Payer: Self-pay

## 2019-11-13 ENCOUNTER — Encounter (INDEPENDENT_AMBULATORY_CARE_PROVIDER_SITE_OTHER): Payer: Self-pay | Admitting: Family Medicine

## 2019-11-13 ENCOUNTER — Ambulatory Visit (INDEPENDENT_AMBULATORY_CARE_PROVIDER_SITE_OTHER): Payer: Medicare PPO | Admitting: Family Medicine

## 2019-11-13 VITALS — BP 115/74 | HR 63 | Temp 98.5°F | Ht 66.0 in | Wt 203.0 lb

## 2019-11-13 DIAGNOSIS — E7849 Other hyperlipidemia: Secondary | ICD-10-CM

## 2019-11-13 DIAGNOSIS — E669 Obesity, unspecified: Secondary | ICD-10-CM | POA: Diagnosis not present

## 2019-11-13 DIAGNOSIS — Z6832 Body mass index (BMI) 32.0-32.9, adult: Secondary | ICD-10-CM

## 2019-11-13 DIAGNOSIS — E559 Vitamin D deficiency, unspecified: Secondary | ICD-10-CM

## 2019-11-13 MED ORDER — CHOLECALCIFEROL 1.25 MG (50000 UT) PO TABS: 50000.0000 [IU] | ORAL_TABLET | ORAL | 0 refills | Status: DC

## 2019-11-13 NOTE — Progress Notes (Signed)
Chief Complaint:   OBESITY [name] is here to discuss her progress with her obesity treatment plan along with follow-up of her obesity related diagnoses. [name] is on the Category 3 Plan and states she is following her eating plan approximately 90% of the time. [name] states she is exercising with a trainer and walking for 45 minutes 2-5 times per week.  Today's visit was #: 9 Starting weight: 219 lbs Starting date: 06/19/2019 Today's weight: 203 lbs Today's date: 11/13/2019 Total lbs lost to date: 16 Total lbs lost since last in-office visit: 1  Interim History: [name] notes occasionally not able to get all the food in, normally this is at lunchtime. She will then snack until dinner when she is not eating all of her lunch. Next few weeks she has no plans. She is interested in starting to figure out how to put the meals together herself.  Subjective:   1. Other hyperlipidemia [name]'s last LDL improved from her first draw from 162 to 147, and triglycerides 207 to 183.  2. Vitamin D deficiency [name] is on Vit D2 50,000 IU weekly, and she denies nausea, vomiting, or muscle weakness. She notes fatigue.  Assessment/Plan:   1. Other hyperlipidemia Cardiovascular risk and specific lipid/LDL goals reviewed. We discussed several lifestyle modifications today and [name] will continue to work on diet, exercise and weight loss efforts. We will repeat labs in late January 2022. Orders and follow up as documented in patient record.   Counseling Intensive lifestyle modifications are the first line treatment for this issue. . Dietary changes: Increase soluble fiber. Decrease simple carbohydrates. . Exercise changes: Moderate to vigorous-intensity aerobic activity 150 minutes per week if tolerated. . Lipid-lowering medications: see documented in medical record.  2. Vitamin D deficiency Low Vitamin D level contributes to fatigue and are associated with obesity, breast, and colon cancer. [name] agreed  to start prescription Vitamin D3 50,000 IU every week, with no refills, and stop Vit D2. She will follow-up for routine testing of Vitamin D, at least 2-3 times per year to avoid over-replacement.  - Cholecalciferol 1.25 MG (50000 UT) TABS; Take 50,000 Units by mouth once a week.  Dispense: 4 tablet; Refill: 0  3. Class 1 obesity with serious comorbidity and body mass index (BMI) of 32.0 to 32.9 in adult, unspecified obesity type [name] is currently in the action stage of change. As such, her goal is to continue with weight loss efforts. She has agreed to the Category 3 Plan and keeping a food journal and adhering to recommended goals of 450-600 calories and 40+ grams of protein at supper daily.   Exercise goals: As is.  Behavioral modification strategies: increasing lean protein intake, no skipping meals, meal planning and cooking strategies and keeping healthy foods in the home.  [name] has agreed to follow-up with our clinic in 2 to 3 weeks. She was informed of the importance of frequent follow-up visits to maximize her success with intensive lifestyle modifications for her multiple health conditions.   Objective:   Blood pressure 115/74, pulse 63, temperature 98.5 F (36.9 C), temperature source Oral, height 5\' 6"  (1.676 m), weight 203 lb (92.1 kg), SpO2 98 %. Body mass index is 32.77 kg/m.  General: Cooperative, alert, well developed, in no acute distress. HEENT: Conjunctivae and lids unremarkable. Cardiovascular: Regular rhythm.  Lungs: Normal work of breathing. Neurologic: No focal deficits.   Lab Results  Component Value Date   CREATININE 0.91 06/19/2019   BUN 16 06/19/2019  NA 141 06/19/2019   K 4.4 06/19/2019   CL 104 06/19/2019   CO2 23 06/19/2019   Lab Results  Component Value Date   ALT 15 06/19/2019   AST 25 06/19/2019   ALKPHOS 100 06/19/2019   BILITOT 0.4 06/19/2019   No results found for: HGBA1C No results found for: INSULIN Lab Results  Component Value  Date   TSH 1.220 06/19/2019   Lab Results  Component Value Date   CHOL 221 (H) 10/30/2019   HDL 43 10/30/2019   LDLCALC 145 (H) 10/30/2019   LDLDIRECT 143.0 11/19/2018   TRIG 183 (H) 10/30/2019   CHOLHDL 6 11/19/2018   Lab Results  Component Value Date   WBC 8.7 06/19/2019   HGB 15.5 06/19/2019   HCT 47.5 (H) 06/19/2019   MCV 93 06/19/2019   PLT 276 06/19/2019   No results found for: IRON, TIBC, FERRITIN  Obesity Behavioral Intervention:   Approximately 15 minutes were spent on the discussion below.  ASK: We discussed the diagnosis of obesity with [name] today and [name] agreed to give [place] permission to discuss obesity behavioral modification therapy today.  ASSESS: [name] has the diagnosis of obesity and her BMI today is 32.78. [name] is in the action stage of change.   ADVISE: [name] was educated on the multiple health risks of obesity as well as the benefit of weight loss to improve her health. She was advised of the need for long term treatment and the importance of lifestyle modifications to improve her current health and to decrease her risk of future health problems.  AGREE: Multiple dietary modification options and treatment options were discussed and [name] agreed to follow the recommendations documented in the above note.  ARRANGE: [name] was educated on the importance of frequent visits to treat obesity as outlined per CMS and USPSTF guidelines and agreed to schedule her next follow up appointment today.  Attestation Statements:   Reviewed by clinician on day of visit: allergies, medications, problem list, medical history, surgical history, family history, social history, and previous encounter notes.   I, Trixie Dredge, am acting as transcriptionist for Coralie Common, MD. I have reviewed the above documentation for accuracy and completeness, and I agree with the above. - Jinny Blossom, MD

## 2019-11-22 ENCOUNTER — Other Ambulatory Visit: Payer: Self-pay

## 2019-11-22 ENCOUNTER — Encounter: Payer: Medicare Other | Admitting: Internal Medicine

## 2019-11-22 ENCOUNTER — Encounter: Payer: Self-pay | Admitting: Internal Medicine

## 2019-11-22 ENCOUNTER — Ambulatory Visit (INDEPENDENT_AMBULATORY_CARE_PROVIDER_SITE_OTHER): Payer: Medicare PPO | Admitting: Internal Medicine

## 2019-11-22 VITALS — BP 130/80 | HR 70 | Temp 97.6°F | Ht 66.0 in | Wt 205.5 lb

## 2019-11-22 DIAGNOSIS — E669 Obesity, unspecified: Secondary | ICD-10-CM | POA: Diagnosis not present

## 2019-11-22 DIAGNOSIS — Z0000 Encounter for general adult medical examination without abnormal findings: Secondary | ICD-10-CM

## 2019-11-22 DIAGNOSIS — Z23 Encounter for immunization: Secondary | ICD-10-CM

## 2019-11-22 DIAGNOSIS — Z7189 Other specified counseling: Secondary | ICD-10-CM | POA: Diagnosis not present

## 2019-11-22 DIAGNOSIS — G629 Polyneuropathy, unspecified: Secondary | ICD-10-CM

## 2019-11-22 DIAGNOSIS — E538 Deficiency of other specified B group vitamins: Secondary | ICD-10-CM

## 2019-11-22 DIAGNOSIS — E785 Hyperlipidemia, unspecified: Secondary | ICD-10-CM | POA: Diagnosis not present

## 2019-11-22 NOTE — Assessment & Plan Note (Signed)
Better with lifestyle No primary prevention

## 2019-11-22 NOTE — Assessment & Plan Note (Signed)
Doing well with lifestyle changes in formal program

## 2019-11-22 NOTE — Patient Instructions (Signed)
Please set up your screening mammogram. 

## 2019-11-22 NOTE — Progress Notes (Signed)
Hearing Screening   125Hz  250Hz  500Hz  1000Hz  2000Hz  3000Hz  4000Hz  6000Hz  8000Hz   Right ear:           Left ear:           Comments: Pt wears hearing aides  Vision Screening Comments: Last eye exam 11/08/19 at [facility]

## 2019-11-22 NOTE — Assessment & Plan Note (Signed)
Mild sensory changes in hands No pain--so no meds needed

## 2019-11-22 NOTE — Progress Notes (Signed)
Subjective:    Patient ID: [name], female    DOB: Aug 15, 1945, 74 y.o.   MRN: [id number]  HPI Here for Medicare wellness visit and follow up of chronic health conditions This visit occurred during the SARS-CoV-2 public health emergency.  Safety protocols were in place, including screening questions prior to the visit, additional usage of staff PPE, and extensive cleaning of exam room while observing appropriate contact time as indicated for disinfecting solutions.   Reviewed advanced directives  Reviewed other doctors---Dr [facility], Dr Gaspar Bidding ([place])--ophthal, Dr Laymond Purser, Dr Cinda Quest Doing strength training twice a week---plus walking Vision not great---will probably get cataract surgery Continues on glaucoma drops Hearing aides work fine No falls No hospitalization or surgery in past year No depression or anhedonia Independent with instrumental ADLs No memory problems  Did start supervised weight program Has lost about 20# in the past year  Has had a decrease in cholesterol levels with treatment Is on D, B12   Current Outpatient Medications on File Prior to Visit  Medication Sig Dispense Refill  . brimonidine-timolol (COMBIGAN) 0.2-0.5 % ophthalmic solution Place 1 drop into both eyes every 12 (twelve) hours.    . Cholecalciferol 1.25 MG (50000 UT) TABS Take 50,000 Units by mouth once a week. 4 tablet 0  . vitamin B-12 (CYANOCOBALAMIN) 1000 MCG tablet Take 1,000 mcg by mouth daily.     No current facility-administered medications on file prior to visit.    No Known Allergies  Past Medical History:  Diagnosis Date  . B12 deficiency   . BPPV (benign paroxysmal positional vertigo)   . Endometrial cancer ([place]) 2010   s/p hysterectomy  . Gallbladder problem   . Glaucoma    Dr Gaspar Bidding [facility] ([place]  . Neuropathy     Past Surgical History:  Procedure Laterality Date  . ABDOMINAL HYSTERECTOMY  2010   Endometrial Ca  . CHOLECYSTECTOMY   2010  . OOPHORECTOMY Bilateral   . TONSILLECTOMY      Family History  Problem Relation Age of Onset  . Cancer Father        colon  . Heart disease Neg Hx   . Diabetes Neg Hx   . Breast cancer Neg Hx     Social History   Socioeconomic History  . Marital status: Single    Spouse name: Not on file  . Number of children: 1  . Years of education: Not on file  . Highest education level: Not on file  Occupational History  . Occupation: Radio producer    Comment: Retired  Tobacco Use  . Smoking status: Never Smoker  . Smokeless tobacco: Never Used  Substance and Sexual Activity  . Alcohol use: Yes    Comment: Rarely  . Drug use: No  . Sexual activity: Not on file  Other Topics Concern  . Not on file  Social History Narrative   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      Has living will   Has health care POA-----sister Tressie Stalker   Would accept resuscitation   Hasn't considered tube feeds      Social Determinants of Health   Financial Resource Strain:   . Difficulty of Paying Living Expenses: Not on file  Food Insecurity:   . Worried About Charity fundraiser in the Last Year: Not on file  . Ran Out of Food in the Last Year: Not on file  Transportation Needs:   . Lack of Transportation (Medical): Not on file  .  Lack of Transportation (Non-Medical): Not on file  Physical Activity:   . Days of Exercise per Week: Not on file  . Minutes of Exercise per Session: Not on file  Stress:   . Feeling of Stress : Not on file  Social Connections:   . Frequency of Communication with Friends and Family: Not on file  . Frequency of Social Gatherings with Friends and Family: Not on file  . Attends Religious Services: Not on file  . Active Member of Clubs or Organizations: Not on file  . Attends Archivist Meetings: Not on file  . Marital Status: Not on file  Intimate Partner Violence:   . Fear of Current or Ex-Partner: Not on file  . Emotionally Abused: Not on  file  . Physically Abused: Not on file  . Sexually Abused: Not on file   Review of Systems Appetite is good Sleeps fair--some bad nights (if not active during the day) Wears seat belt Teeth are okay--sees dentist No suspicious skin lesions---Rx for rosacea (topical Rx) No heartburn or dysphagia Bowels are fine---no blood No chest pain or SOB No dizziness or syncope No edema No dysuria or incontinence No sig back or joint pains    Objective:   Physical Exam Constitutional:      Appearance: Normal appearance.  HENT:     Mouth/Throat:     Comments: No lesions Eyes:     Conjunctiva/sclera: Conjunctivae normal.     Pupils: Pupils are equal, round, and reactive to light.  Cardiovascular:     Rate and Rhythm: Normal rate and regular rhythm.     Pulses: Normal pulses.     Heart sounds: No murmur heard.  No gallop.   Pulmonary:     Effort: Pulmonary effort is normal.     Breath sounds: Normal breath sounds. No wheezing or rales.  Abdominal:     Palpations: Abdomen is soft.     Tenderness: There is no abdominal tenderness.  Musculoskeletal:     Right lower leg: No edema.     Left lower leg: No edema.  Skin:    General: Skin is warm.     Findings: No rash.  Neurological:     Mental Status: She is alert and oriented to person, place, and time.     Comments: President---"Joe Biden, Trump, Obama" [id number] Recall 3/3  Psychiatric:        Mood and Affect: Mood normal.        Behavior: Behavior normal.            Assessment & Plan:

## 2019-11-22 NOTE — Assessment & Plan Note (Signed)
I have personally reviewed the Medicare Annual Wellness questionnaire and have noted 1. The patient's medical and social history 2. Their use of alcohol, tobacco or illicit drugs 3. Their current medications and supplements 4. The patient's functional ability including ADL's, fall risks, home safety risks and hearing or visual             impairment. 5. Diet and physical activities 6. Evidence for depression or mood disorders  The patients weight, height, BMI and visual acuity have been recorded in the chart I have made referrals, counseling and provided education to the patient based review of the above and I have provided the pt with a written personalized care plan for preventive services.  I have provided you with a copy of your personalized plan for preventive services. Please take the time to review along with your updated medication list.  FIT yearly--due 2022 She will schedule screening mammogram Doing better with lifestyle Flu vaccine today Get COVID booster

## 2019-11-22 NOTE — Assessment & Plan Note (Signed)
On supplements 

## 2019-11-22 NOTE — Assessment & Plan Note (Signed)
See social history 

## 2019-12-03 ENCOUNTER — Encounter (INDEPENDENT_AMBULATORY_CARE_PROVIDER_SITE_OTHER): Payer: Self-pay | Admitting: Family Medicine

## 2019-12-03 ENCOUNTER — Other Ambulatory Visit: Payer: Self-pay

## 2019-12-03 ENCOUNTER — Ambulatory Visit (INDEPENDENT_AMBULATORY_CARE_PROVIDER_SITE_OTHER): Payer: Medicare PPO | Admitting: Family Medicine

## 2019-12-03 VITALS — BP 119/68 | HR 72 | Temp 98.1°F | Ht 66.0 in | Wt 200.0 lb

## 2019-12-03 DIAGNOSIS — E559 Vitamin D deficiency, unspecified: Secondary | ICD-10-CM

## 2019-12-03 DIAGNOSIS — E669 Obesity, unspecified: Secondary | ICD-10-CM

## 2019-12-03 DIAGNOSIS — E7849 Other hyperlipidemia: Secondary | ICD-10-CM

## 2019-12-03 DIAGNOSIS — Z6832 Body mass index (BMI) 32.0-32.9, adult: Secondary | ICD-10-CM | POA: Diagnosis not present

## 2019-12-03 MED ORDER — CHOLECALCIFEROL 1.25 MG (50000 UT) PO TABS: 50000.0000 [IU] | ORAL_TABLET | ORAL | 0 refills | Status: DC

## 2019-12-10 NOTE — Progress Notes (Signed)
Chief Complaint:   [name] is here to discuss her progress with her obesity treatment plan along with follow-up of her obesity related diagnoses. [name] is on the Category 3 Plan and states she is following her eating plan approximately 90% of the time. [name] states she is working with a trainer 45 minutes 2 times per week and walking 1 mile 7 times per week.  Today's visit was #: 10 Starting weight: 219 lbs Starting date: 06/19/2019 Today's weight: 200 lbs Today's date: 12/03/2019 Total lbs lost to date: 19 Total lbs lost since last in-office visit: 3  Interim History: [name] saw her PCP over the last few weeks and he was very interested in the patient's participation in the program. The last 10% the patient voices she ate off plan - same food, just not necessarily the plan. She denies temptations or cravings. She is still work on Editor, commissioning with a Clinical research associate.  Subjective:   Vitamin D deficiency. No nausea, vomiting, or muscle weakness, but endorses fatigue. [name] is on prescription Vitamin D supplementation. Last Vitamin D level was 40.0.   Ref. Range 10/30/2019 11:09  Vitamin D, 25-Hydroxy Latest Ref Range: 30.0 - 100.0 ng/mL 40.0   Other hyperlipidemia. LDL 145, HDL 43, triglycerides 183 on 10/30/2019. [name] is not on a statin.  Lab Results  Component Value Date   CHOL 221 (H) 10/30/2019   HDL 43 10/30/2019   LDLCALC 145 (H) 10/30/2019   LDLDIRECT 143.0 11/19/2018   TRIG 183 (H) 10/30/2019   CHOLHDL 6 11/19/2018   Lab Results  Component Value Date   ALT 15 06/19/2019   AST 25 06/19/2019   ALKPHOS 100 06/19/2019   BILITOT 0.4 06/19/2019   The 10-year ASCVD risk score [name], [name], [name]) is: 11.8%   Values used to calculate the score:     Age: 74 years     Sex: Female     Is Non-Hispanic African American: No     Diabetic: No     Tobacco smoker: No     Systolic Blood Pressure: 086 mmHg     Is BP treated: No     HDL Cholesterol: 43  mg/dL     Total Cholesterol: 221 mg/dL  Assessment/Plan:   Vitamin D deficiency. Low Vitamin D level contributes to fatigue and are associated with obesity, breast, and colon cancer. She was given a refill on her Cholecalciferol 1.25 MG (50000 UT) TABS every week #4 with 0 refills and will follow-up for routine testing of Vitamin D, at least 2-3 times per year to avoid over-replacement.   Other hyperlipidemia. Cardiovascular risk and specific lipid/LDL goals reviewed.  We discussed several lifestyle modifications today and [name] will continue to work on diet, exercise and weight loss efforts. Orders and follow up as documented in patient record. Labs will be repeated in February 2022.  Counseling Intensive lifestyle modifications are the first line treatment for this issue. . Dietary changes: Increase soluble fiber. Decrease simple carbohydrates. . Exercise changes: Moderate to vigorous-intensity aerobic activity 150 minutes per week if tolerated. . Lipid-lowering medications: see documented in medical record.  Class 1 obesity with serious comorbidity and body mass index (BMI) of 32.0 to 32.9 in adult, unspecified obesity type.  [name] is currently in the action stage of change. As such, her goal is to continue with weight loss efforts. She has agreed to the Category 3 Plan.   Exercise goals: [name] will continue her current exercise regimen.  Behavioral modification strategies: increasing lean protein intake, meal planning and cooking strategies, keeping healthy foods in the home and planning for success.  [name] has agreed to follow-up with our clinic in 3 weeks. She was informed of the importance of frequent follow-up visits to maximize her success with intensive lifestyle modifications for her multiple health conditions.   Objective:   Blood pressure 119/68, pulse 72, temperature 98.1 F (36.7 C), temperature source Oral, height 5\' 6"  (1.676 m), weight 200 lb (90.7 kg), SpO2 98 %. Body  mass index is 32.28 kg/m.  General: Cooperative, alert, well developed, in no acute distress. HEENT: Conjunctivae and lids unremarkable. Cardiovascular: Regular rhythm.  Lungs: Normal work of breathing. Neurologic: No focal deficits.   Lab Results  Component Value Date   CREATININE 0.91 06/19/2019   BUN 16 06/19/2019   NA 141 06/19/2019   K 4.4 06/19/2019   CL 104 06/19/2019   CO2 23 06/19/2019   Lab Results  Component Value Date   ALT 15 06/19/2019   AST 25 06/19/2019   ALKPHOS 100 06/19/2019   BILITOT 0.4 06/19/2019   No results found for: HGBA1C No results found for: INSULIN Lab Results  Component Value Date   TSH 1.220 06/19/2019   Lab Results  Component Value Date   CHOL 221 (H) 10/30/2019   HDL 43 10/30/2019   LDLCALC 145 (H) 10/30/2019   LDLDIRECT 143.0 11/19/2018   TRIG 183 (H) 10/30/2019   CHOLHDL 6 11/19/2018   Lab Results  Component Value Date   WBC 8.7 06/19/2019   HGB 15.5 06/19/2019   HCT 47.5 (H) 06/19/2019   MCV 93 06/19/2019   PLT 276 06/19/2019   No results found for: IRON, TIBC, FERRITIN  Obesity Behavioral Intervention:   Approximately 15 minutes were spent on the discussion below.  ASK: We discussed the diagnosis of obesity with [name] today and [name] agreed to give [place] permission to discuss obesity behavioral modification therapy today.  ASSESS: [name] has the diagnosis of obesity and her BMI today is 32.4. [name] is in the action stage of change.   ADVISE: [name] was educated on the multiple health risks of obesity as well as the benefit of weight loss to improve her health. She was advised of the need for long term treatment and the importance of lifestyle modifications to improve her current health and to decrease her risk of future health problems.  AGREE: Multiple dietary modification options and treatment options were discussed and [name] agreed to follow the recommendations documented in the above note.  ARRANGE: [name] was  educated on the importance of frequent visits to treat obesity as outlined per CMS and USPSTF guidelines and agreed to schedule her next follow up appointment today.  Attestation Statements:   Reviewed by clinician on day of visit: allergies, medications, problem list, medical history, surgical history, family history, social history, and previous encounter notes.  I, Michaelene Song, am acting as transcriptionist for Coralie Common, MD   I have reviewed the above documentation for accuracy and completeness, and I agree with the above. - Jinny Blossom, MD

## 2019-12-16 ENCOUNTER — Encounter (INDEPENDENT_AMBULATORY_CARE_PROVIDER_SITE_OTHER): Payer: Self-pay

## 2019-12-24 ENCOUNTER — Encounter (INDEPENDENT_AMBULATORY_CARE_PROVIDER_SITE_OTHER): Payer: Self-pay | Admitting: Family Medicine

## 2019-12-24 ENCOUNTER — Ambulatory Visit (INDEPENDENT_AMBULATORY_CARE_PROVIDER_SITE_OTHER): Payer: Medicare PPO | Admitting: Family Medicine

## 2019-12-24 ENCOUNTER — Other Ambulatory Visit: Payer: Self-pay

## 2019-12-24 VITALS — BP 133/80 | HR 70 | Temp 98.3°F | Ht 66.0 in | Wt 198.0 lb

## 2019-12-24 DIAGNOSIS — E559 Vitamin D deficiency, unspecified: Secondary | ICD-10-CM | POA: Diagnosis not present

## 2019-12-24 DIAGNOSIS — Z6832 Body mass index (BMI) 32.0-32.9, adult: Secondary | ICD-10-CM | POA: Diagnosis not present

## 2019-12-24 DIAGNOSIS — E7849 Other hyperlipidemia: Secondary | ICD-10-CM | POA: Diagnosis not present

## 2019-12-24 DIAGNOSIS — E669 Obesity, unspecified: Secondary | ICD-10-CM

## 2019-12-25 NOTE — Progress Notes (Signed)
Chief Complaint:   OBESITY [name] is here to discuss her progress with her obesity treatment plan along with follow-up of her obesity related diagnoses. [name] is on the Category 3 Plan and states she is following her eating plan approximately 80% of the time. [name] states she is exercising with a trainer and doing strengthening for 45 minutes 2 times per week.  Today's visit was #: 11 Starting weight: 219 lbs Starting date: 06/19/2019 Today's weight: 199 lbs Today's date: 12/24/2019 Total lbs lost to date: 20 Total lbs lost since last in-office visit: 1  Interim History: [name] has returned to our clinic and has been finding the last 3 weeks to be difficult. She tends to snack more and it isn't due to hunger. She thinks monotony is playing a part in her snacking. For snacks she is doing [place] bars, fruit, and other stuff that is in her fridge.    Subjective:   1. Vitamin D deficiency [name] denies nausea, vomiting, or muscle weakness, but she notes fatigue. She is on prescription Vit D.  2. Other hyperlipidemia [name]'s last LDL was 145 and HDL 43. She is not on statin.  Assessment/Plan:   1. Vitamin D deficiency Low Vitamin D level contributes to fatigue and are associated with obesity, breast, and colon cancer. [name] agreed to continue taking prescription Vitamin D 50,000 IU every week, no refill needed. We will repeat labs in January 2022. She will follow-up for routine testing of Vitamin D, at least 2-3 times per year to avoid over-replacement.  2. Other hyperlipidemia Cardiovascular risk and specific lipid/LDL goals reviewed. We discussed several lifestyle modifications today. [name] will continue to work on diet, exercise and weight loss efforts. No change in medications management. Orders and follow up as documented in patient record.   Counseling Intensive lifestyle modifications are the first line treatment for this issue.  Dietary changes: Increase soluble fiber. Decrease  simple carbohydrates.  Exercise changes: Moderate to vigorous-intensity aerobic activity 150 minutes per week if tolerated.  Lipid-lowering medications: see documented in medical record.  3. Class 1 obesity with serious comorbidity and body mass index (BMI) of 32.0 to 32.9 in adult, unspecified obesity type [name] is currently in the action stage of change. As such, her goal is to continue with weight loss efforts. She has agreed to the Category 3 Plan.   We will repeat IC in January.  Exercise goals: As is.  Behavioral modification strategies: increasing lean protein intake, meal planning and cooking strategies, keeping healthy foods in the home and planning for success.  [name] has agreed to follow-up with our clinic in 3 weeks. She was informed of the importance of frequent follow-up visits to maximize her success with intensive lifestyle modifications for her multiple health conditions.   Objective:   Blood pressure 133/80, pulse 70, temperature 98.3 F (36.8 C), temperature source Oral, height 5\' 6"  (1.676 m), weight 198 lb (89.8 kg), SpO2 99 %. Body mass index is 31.96 kg/m.  General: Cooperative, alert, well developed, in no acute distress. HEENT: Conjunctivae and lids unremarkable. Cardiovascular: Regular rhythm.  Lungs: Normal work of breathing. Neurologic: No focal deficits.   Lab Results  Component Value Date   CREATININE 0.91 06/19/2019   BUN 16 06/19/2019   NA 141 06/19/2019   K 4.4 06/19/2019   CL 104 06/19/2019   CO2 23 06/19/2019   Lab Results  Component Value Date   ALT 15 06/19/2019   AST 25 06/19/2019   ALKPHOS 100  06/19/2019   BILITOT 0.4 06/19/2019   No results found for: HGBA1C No results found for: INSULIN Lab Results  Component Value Date   TSH 1.220 06/19/2019   Lab Results  Component Value Date   CHOL 221 (H) 10/30/2019   HDL 43 10/30/2019   LDLCALC 145 (H) 10/30/2019   LDLDIRECT 143.0 11/19/2018   TRIG 183 (H) 10/30/2019   CHOLHDL 6  11/19/2018   Lab Results  Component Value Date   WBC 8.7 06/19/2019   HGB 15.5 06/19/2019   HCT 47.5 (H) 06/19/2019   MCV 93 06/19/2019   PLT 276 06/19/2019   No results found for: IRON, TIBC, FERRITIN  Attestation Statements:   Reviewed by clinician on day of visit: allergies, medications, problem list, medical history, surgical history, family history, social history, and previous encounter notes.  Time spent on visit including pre-visit chart review and post-visit care and charting was 16 minutes.    I, Trixie Dredge, am acting as transcriptionist for Coralie Common, MD.  I have reviewed the above documentation for accuracy and completeness, and I agree with the above. - Jinny Blossom, MD

## 2019-12-27 ENCOUNTER — Other Ambulatory Visit: Payer: Self-pay | Admitting: Internal Medicine

## 2019-12-27 DIAGNOSIS — Z1231 Encounter for screening mammogram for malignant neoplasm of breast: Secondary | ICD-10-CM

## 2020-01-15 ENCOUNTER — Other Ambulatory Visit: Payer: Self-pay

## 2020-01-15 ENCOUNTER — Ambulatory Visit (INDEPENDENT_AMBULATORY_CARE_PROVIDER_SITE_OTHER): Payer: Medicare PPO | Admitting: Family Medicine

## 2020-01-15 ENCOUNTER — Encounter (INDEPENDENT_AMBULATORY_CARE_PROVIDER_SITE_OTHER): Payer: Self-pay | Admitting: Family Medicine

## 2020-01-15 VITALS — BP 107/71 | HR 45 | Temp 98.2°F | Ht 66.0 in | Wt 198.0 lb

## 2020-01-15 DIAGNOSIS — Z6832 Body mass index (BMI) 32.0-32.9, adult: Secondary | ICD-10-CM | POA: Diagnosis not present

## 2020-01-15 DIAGNOSIS — E669 Obesity, unspecified: Secondary | ICD-10-CM | POA: Diagnosis not present

## 2020-01-15 DIAGNOSIS — E538 Deficiency of other specified B group vitamins: Secondary | ICD-10-CM | POA: Diagnosis not present

## 2020-01-15 DIAGNOSIS — E559 Vitamin D deficiency, unspecified: Secondary | ICD-10-CM

## 2020-01-15 MED ORDER — CHOLECALCIFEROL 1.25 MG (50000 UT) PO TABS: 50000.0000 [IU] | ORAL_TABLET | ORAL | 0 refills | Status: DC

## 2020-01-15 NOTE — Progress Notes (Signed)
Chief Complaint:   OBESITY [name] is here to discuss her progress with her obesity treatment plan along with follow-up of her obesity related diagnoses. [name] is on the Category 3 Plan and states she is following her eating plan approximately 80% of the time. [name] states she is working with a Clinical research associate for 45 minutes 2 times per week.  Today's visit was #: 12 Starting weight: 219 lbs Starting date: 06/19/2019 Today's weight: 198 lbs Today's date: 01/15/2020 Total lbs lost to date: 21 lbs Total lbs lost since last in-office visit: 1 lb  Interim History: [name] had a low key Thanksgiving but is planning for upcoming visitors for the Christmas holiday.  Her son is coming for 10 days in the upcoming weeks.  She says she is still doing a combination of working with a trainer and at home exercises.  She does not deviate from the plan.  Not getting all protein in at the evening meal.  Subjective:   1. Vitamin D deficiency [name]'s Vitamin D level was 40.0 on 10/30/2019. She is currently taking prescription vitamin D 50,000 IU each week. She denies nausea, vomiting or muscle weakness.  She endorses fatigue.  2. B12 deficiency She is on an OTC oral vitamin B12 supplement.  She endorses fatigue.   Lab Results  Component Value Date   VITAMINB12 1,185 10/30/2019   Assessment/Plan:   1. Vitamin D deficiency Low Vitamin D level contributes to fatigue and are associated with obesity, breast, and colon cancer. She agrees to continue to take prescription Vitamin D @50 ,000 IU every week and will follow-up for routine testing of Vitamin D, at least 2-3 times per year to avoid over-replacement.  -Refill Cholecalciferol 1.25 MG (50000 UT) TABS; Take 50,000 Units by mouth once a week.  Dispense: 4 tablet; Refill: 0  2. B12 deficiency Continue OTC vitamin B12 with no change in dose.  3. Class 1 obesity with serious comorbidity and body mass index (BMI) of 32.0 to 32.9 in adult, unspecified obesity  type  [name] is currently in the action stage of change. As such, her goal is to continue with weight loss efforts. She has agreed to the Category 3 Plan with 8 ounces of protein at night.   Exercise goals: As is.  Behavioral modification strategies: increasing lean protein intake, meal planning and cooking strategies, keeping healthy foods in the home and planning for success.  [name] has agreed to follow-up with our clinic in 4 weeks. She was informed of the importance of frequent follow-up visits to maximize her success with intensive lifestyle modifications for her multiple health conditions.   Objective:   Blood pressure 107/71, pulse (!) 45, temperature 98.2 F (36.8 C), temperature source Oral, height 5\' 6"  (1.676 m), weight 198 lb (89.8 kg), SpO2 99 %. Body mass index is 31.96 kg/m.  General: Cooperative, alert, well developed, in no acute distress. HEENT: Conjunctivae and lids unremarkable. Cardiovascular: Regular rhythm.  Lungs: Normal work of breathing. Neurologic: No focal deficits.   Lab Results  Component Value Date   CREATININE 0.91 06/19/2019   BUN 16 06/19/2019   NA 141 06/19/2019   K 4.4 06/19/2019   CL 104 06/19/2019   CO2 23 06/19/2019   Lab Results  Component Value Date   ALT 15 06/19/2019   AST 25 06/19/2019   ALKPHOS 100 06/19/2019   BILITOT 0.4 06/19/2019   Lab Results  Component Value Date   TSH 1.220 06/19/2019   Lab Results  Component Value  Date   CHOL 221 (H) 10/30/2019   HDL 43 10/30/2019   LDLCALC 145 (H) 10/30/2019   LDLDIRECT 143.0 11/19/2018   TRIG 183 (H) 10/30/2019   CHOLHDL 6 11/19/2018   Lab Results  Component Value Date   WBC 8.7 06/19/2019   HGB 15.5 06/19/2019   HCT 47.5 (H) 06/19/2019   MCV 93 06/19/2019   PLT 276 06/19/2019   Obesity Behavioral Intervention:   Approximately 15 minutes were spent on the discussion below.  ASK: We discussed the diagnosis of obesity with [name] today and [name] agreed to give [place]  permission to discuss obesity behavioral modification therapy today.  ASSESS: [name] has the diagnosis of obesity and her BMI today is 32.0. Kayani is in the action stage of change.   ADVISE: [name] was educated on the multiple health risks of obesity as well as the benefit of weight loss to improve her health. She was advised of the need for long term treatment and the importance of lifestyle modifications to improve her current health and to decrease her risk of future health problems.  AGREE: Multiple dietary modification options and treatment options were discussed and [name] agreed to follow the recommendations documented in the above note.  ARRANGE: [name] was educated on the importance of frequent visits to treat obesity as outlined per CMS and USPSTF guidelines and agreed to schedule her next follow up appointment today.  Attestation Statements:   Reviewed by clinician on day of visit: allergies, medications, problem list, medical history, surgical history, family history, social history, and previous encounter notes.  I, Water quality scientist, CMA, am acting as transcriptionist for Coralie Common, MD.  I have reviewed the above documentation for accuracy and completeness, and I agree with the above. - Jinny Blossom, MD

## 2020-01-17 ENCOUNTER — Other Ambulatory Visit (INDEPENDENT_AMBULATORY_CARE_PROVIDER_SITE_OTHER): Payer: Self-pay | Admitting: Family Medicine

## 2020-01-17 DIAGNOSIS — E559 Vitamin D deficiency, unspecified: Secondary | ICD-10-CM

## 2020-01-23 ENCOUNTER — Other Ambulatory Visit: Payer: Self-pay

## 2020-01-23 ENCOUNTER — Ambulatory Visit
Admission: RE | Admit: 2020-01-23 | Discharge: 2020-01-23 | Disposition: A | Discharge status: Home / Self Care | Payer: Medicare PPO | Source: Ambulatory Visit | Attending: Internal Medicine | Admitting: Internal Medicine

## 2020-01-23 DIAGNOSIS — Z1231 Encounter for screening mammogram for malignant neoplasm of breast: Secondary | ICD-10-CM | POA: Insufficient documentation

## 2020-01-31 DIAGNOSIS — L719 Rosacea, unspecified: Secondary | ICD-10-CM | POA: Diagnosis not present

## 2020-02-17 DIAGNOSIS — H25812 Combined forms of age-related cataract, left eye: Secondary | ICD-10-CM | POA: Diagnosis not present

## 2020-02-19 ENCOUNTER — Other Ambulatory Visit: Payer: Self-pay

## 2020-02-19 ENCOUNTER — Ambulatory Visit (INDEPENDENT_AMBULATORY_CARE_PROVIDER_SITE_OTHER): Payer: Medicare PPO | Admitting: Family Medicine

## 2020-02-19 ENCOUNTER — Encounter (INDEPENDENT_AMBULATORY_CARE_PROVIDER_SITE_OTHER): Payer: Self-pay | Admitting: Family Medicine

## 2020-02-19 VITALS — BP 127/76 | HR 66 | Temp 97.8°F | Ht 66.0 in | Wt 196.0 lb

## 2020-02-19 DIAGNOSIS — E559 Vitamin D deficiency, unspecified: Secondary | ICD-10-CM

## 2020-02-19 DIAGNOSIS — Z6831 Body mass index (BMI) 31.0-31.9, adult: Secondary | ICD-10-CM

## 2020-02-19 DIAGNOSIS — E669 Obesity, unspecified: Secondary | ICD-10-CM | POA: Diagnosis not present

## 2020-02-19 DIAGNOSIS — E7849 Other hyperlipidemia: Secondary | ICD-10-CM | POA: Diagnosis not present

## 2020-02-19 MED ORDER — CHOLECALCIFEROL 1.25 MG (50000 UT) PO TABS: 50000.0000 [IU] | ORAL_TABLET | ORAL | 0 refills | Status: DC

## 2020-02-19 NOTE — Progress Notes (Signed)
Chief Complaint:   OBESITY [name] is here to discuss her progress with her obesity treatment plan along with follow-up of her obesity related diagnoses. [name] is on the Category 3 Plan and states she is following her eating plan approximately 50% of the time. [name] states she is worknig with a physical trainer 45 minutes 2 times per week.  Today's visit was #: 13 Starting weight: 219 lbs Starting date: 06/19/2019 Today's weight: 196 lbs  Today's date: 02/19/2020 Total lbs lost to date: 23 Total lbs lost since last in-office visit: 2  Interim History: [name] had a good holiday. Son and his girlfriend tested positive for Covid after arriving here for holiday. [name] voices she ate at home. She ate close to meal plan as she could. Hardest thing she experienced was leftover food. She still want to lose weight.  Subjective:   1. Vitamin D deficiency [name]'s Vitamin D level was 40.0 on 10/30/2019. She is currently taking prescription vitamin D 50,000 IU each week. She denies nausea, vomiting or muscle weakness, and notes fatigue.  2. Other hyperlipidemia [name] has hyperlipidemia and has been trying to improve her cholesterol levels with intensive lifestyle modification including a low saturated fat diet, exercise and weight loss. She denies any chest pain, claudication or myalgias. No change on statin. Last labs done 10/30/2019.  Lab Results  Component Value Date   ALT 15 06/19/2019   AST 25 06/19/2019   ALKPHOS 100 06/19/2019   BILITOT 0.4 06/19/2019   Lab Results  Component Value Date   CHOL 221 (H) 10/30/2019   HDL 43 10/30/2019   LDLCALC 145 (H) 10/30/2019   LDLDIRECT 143.0 11/19/2018   TRIG 183 (H) 10/30/2019   CHOLHDL 6 11/19/2018      Assessment/Plan:   1. Vitamin D deficiency Low Vitamin D level contributes to fatigue and are associated with obesity, breast, and colon cancer. She agrees to continue to take prescription Vitamin D @50 ,000 IU every week and will follow-up  for routine testing of Vitamin D, at least 2-3 times per year to avoid over-replacement. Will refill Vitamin D. - Cholecalciferol 1.25 MG (50000 UT) TABS; Take 50,000 Units by mouth once a week.  Dispense: 4 tablet; Refill: 0  2. Other hyperlipidemia Cardiovascular risk and specific lipid/LDL goals reviewed.  We discussed several lifestyle modifications today and Deetta will continue to work on diet, exercise and weight loss efforts. Orders and follow up as documented in patient record. Labs in 2 months.  Counseling Intensive lifestyle modifications are the first line treatment for this issue. . Dietary changes: Increase soluble fiber. Decrease simple carbohydrates. . Exercise changes: Moderate to vigorous-intensity aerobic activity 150 minutes per week if tolerated. . Lipid-lowering medications: see documented in medical record.   3. Class 1 obesity with serious comorbidity and body mass index (BMI) of 31.0 to 31.9 in adult, unspecified obesity type  [name] is currently in the action stage of change. As such, her goal is to continue with weight loss efforts. She has agreed to the Category 3 Plan.   Exercise goals: As is. [name] will increase resistance training.  Behavioral modification strategies: increasing lean protein intake, meal planning and cooking strategies, keeping healthy foods in the home and planning for success.  [name] has agreed to follow-up with our clinic in 3 weeks. She was informed of the importance of frequent follow-up visits to maximize her success with intensive lifestyle modifications for her multiple health conditions.    Objective:   Blood pressure  127/76, pulse 66, temperature 97.8 F (36.6 C), temperature source Oral, height 5\' 6"  (1.676 m), weight 196 lb (88.9 kg), SpO2 97 %. Body mass index is 31.64 kg/m.  General: Cooperative, alert, well developed, in no acute distress. HEENT: Conjunctivae and lids unremarkable. Cardiovascular: Regular rhythm.  Lungs:  Normal work of breathing. Neurologic: No focal deficits.   Lab Results  Component Value Date   CREATININE 0.91 06/19/2019   BUN 16 06/19/2019   NA 141 06/19/2019   K 4.4 06/19/2019   CL 104 06/19/2019   CO2 23 06/19/2019   Lab Results  Component Value Date   ALT 15 06/19/2019   AST 25 06/19/2019   ALKPHOS 100 06/19/2019   BILITOT 0.4 06/19/2019   No results found for: HGBA1C No results found for: INSULIN Lab Results  Component Value Date   TSH 1.220 06/19/2019   Lab Results  Component Value Date   CHOL 221 (H) 10/30/2019   HDL 43 10/30/2019   LDLCALC 145 (H) 10/30/2019   LDLDIRECT 143.0 11/19/2018   TRIG 183 (H) 10/30/2019   CHOLHDL 6 11/19/2018   Lab Results  Component Value Date   WBC 8.7 06/19/2019   HGB 15.5 06/19/2019   HCT 47.5 (H) 06/19/2019   MCV 93 06/19/2019   PLT 276 06/19/2019   No results found for: IRON, TIBC, FERRITIN  I, Para March, am acting as transcriptionist for Coralie Common, MD.  I have reviewed the above documentation for accuracy and completeness, and I agree with the above. - Jinny Blossom, MD

## 2020-02-28 DIAGNOSIS — M25512 Pain in left shoulder: Secondary | ICD-10-CM | POA: Diagnosis not present

## 2020-03-05 DIAGNOSIS — M25512 Pain in left shoulder: Secondary | ICD-10-CM | POA: Diagnosis not present

## 2020-03-11 ENCOUNTER — Other Ambulatory Visit: Payer: Self-pay

## 2020-03-11 ENCOUNTER — Ambulatory Visit (INDEPENDENT_AMBULATORY_CARE_PROVIDER_SITE_OTHER): Payer: Medicare PPO | Admitting: Family Medicine

## 2020-03-11 ENCOUNTER — Encounter (INDEPENDENT_AMBULATORY_CARE_PROVIDER_SITE_OTHER): Payer: Self-pay | Admitting: Family Medicine

## 2020-03-11 VITALS — BP 116/76 | HR 65 | Temp 97.8°F | Ht 66.0 in | Wt 198.0 lb

## 2020-03-11 DIAGNOSIS — E669 Obesity, unspecified: Secondary | ICD-10-CM | POA: Diagnosis not present

## 2020-03-11 DIAGNOSIS — E7849 Other hyperlipidemia: Secondary | ICD-10-CM | POA: Diagnosis not present

## 2020-03-11 DIAGNOSIS — E559 Vitamin D deficiency, unspecified: Secondary | ICD-10-CM | POA: Diagnosis not present

## 2020-03-11 DIAGNOSIS — Z6832 Body mass index (BMI) 32.0-32.9, adult: Secondary | ICD-10-CM | POA: Diagnosis not present

## 2020-03-11 MED ORDER — CHOLECALCIFEROL 1.25 MG (50000 UT) PO TABS: 50000.0000 [IU] | ORAL_TABLET | ORAL | 0 refills | Status: DC

## 2020-03-12 DIAGNOSIS — M25512 Pain in left shoulder: Secondary | ICD-10-CM | POA: Diagnosis not present

## 2020-03-12 NOTE — Progress Notes (Signed)
Chief Complaint:   OBESITY Shevonne is here to discuss her progress with her obesity treatment plan along with follow-up of her obesity related diagnoses. Ennifer is on the Category 3 Plan and states she is following her eating plan approximately 75% of the time. Meylin states she is doing PT 60 minutes 1 times per week.  Today's visit was #: 14 Starting weight: 219 lbs Starting date: 06/19/2019 Today's weight: 198 lbs Today's date: 03/11/2020 Total lbs lost to date: 21 lbs Total lbs lost since last in-office visit: 0  Interim History: Makhayla was diagnosed with bicep tendinitis and is getting PT currently and doing exercises to help not aggravate current issues. [name] wasn't following plan as strictly and was adding to meal plan due to monotony and boredom. All her eye surgeries were rescheduled. Gaelyn is looking to maybe put a meal together by herself.  Subjective:   1. Other hyperlipidemia Henli's last LDL 145 and HDL 43 and trigly 183. Sahiti is not on statin.   2. Vitamin D deficiency  Marysa's last Vitamin D was 40.0 in September.  Justyn is on prescription Vitamin D. Stellarose denies nausea, vomiting, or muscle weakness, but notes fatigue.    Assessment/Plan:  1. Other hyperlipidemia Cardiovascular risk and specific lipid/LDL goals reviewed.  We discussed several lifestyle modifications today and Jolin will continue to work on diet, exercise and weight loss efforts. Orders and follow up as documented in patient record. Toniann will continue Cat meal plan.  Counseling Intensive lifestyle modifications are the first line treatment for this issue. . Dietary changes: Increase soluble fiber. Decrease simple carbohydrates. . Exercise changes: Moderate to vigorous-intensity aerobic activity 150 minutes per week if tolerated. . Lipid-lowering medications: see documented in medical record.  2. Vitamin D deficiency Low Vitamin D level contributes to fatigue and are associated with obesity,  breast, and colon cancer. Branda agrees to continue to take prescription Vitamin D @50 ,000 IU every week and will follow-up for routine testing of Vitamin D, at least 2-3 times per year to avoid over-replacement. Refill Vitamin D 50,000 unit weekly #4 no refill. Will check labs at next appointment.  - Cholecalciferol 1.25 MG (50000 UT) TABS; Take 50,000 Units by mouth once a week.  Dispense: 4 tablet; Refill: 0  3. Class 1 obesity with serious comorbidity and body mass index (BMI) of 32.0 to 32.9 in adult, unspecified obesity type  Emiya is currently in the action stage of change. As such, her goal is to continue with weight loss efforts. She has agreed to the Category 3 Plan and keeping a food journal and adhering to recommended goals of 450-600 calories and 40+ grams of protein at supper.   Exercise goals: No exercise has been prescribed at this time.  Behavioral modification strategies: increasing lean protein intake, meal planning and cooking strategies, keeping healthy foods in the home and planning for success.  Iola has agreed to follow-up with our clinic in 3 weeks. She was informed of the importance of frequent follow-up visits to maximize her success with intensive lifestyle modifications for her multiple health conditions.    Objective:   Blood pressure 116/76, pulse 65, temperature 97.8 F (36.6 C), temperature source Oral, height 5\' 6"  (1.676 m), weight 198 lb (89.8 kg), SpO2 99 %. Body mass index is 31.96 kg/m.  General: Cooperative, alert, well developed, in no acute distress. HEENT: Conjunctivae and lids unremarkable. Cardiovascular: Regular rhythm.  Lungs: Normal work of breathing. Neurologic: No focal deficits.   Lab  Results  Component Value Date   CREATININE 0.91 06/19/2019   BUN 16 06/19/2019   NA 141 06/19/2019   K 4.4 06/19/2019   CL 104 06/19/2019   CO2 23 06/19/2019   Lab Results  Component Value Date   ALT 15 06/19/2019   AST 25 06/19/2019   ALKPHOS  100 06/19/2019   BILITOT 0.4 06/19/2019   No results found for: HGBA1C No results found for: INSULIN Lab Results  Component Value Date   TSH 1.220 06/19/2019   Lab Results  Component Value Date   CHOL 221 (H) 10/30/2019   HDL 43 10/30/2019   LDLCALC 145 (H) 10/30/2019   LDLDIRECT 143.0 11/19/2018   TRIG 183 (H) 10/30/2019   CHOLHDL 6 11/19/2018   Lab Results  Component Value Date   WBC 8.7 06/19/2019   HGB 15.5 06/19/2019   HCT 47.5 (H) 06/19/2019   MCV 93 06/19/2019   PLT 276 06/19/2019   No results found for: IRON, TIBC, FERRITIN  Obesity Behavioral Intervention:   Approximately 15 minutes were spent on the discussion below.  ASK: We discussed the diagnosis of obesity with [name] today and Nastasha agreed to give [place] permission to discuss obesity behavioral modification therapy today.  ASSESS: Amylynn has the diagnosis of obesity and her BMI today is 32.0. Reene is in the action stage of change.   ADVISE: Marli was educated on the multiple health risks of obesity as well as the benefit of weight loss to improve her health. She was advised of the need for long term treatment and the importance of lifestyle modifications to improve her current health and to decrease her risk of future health problems.  AGREE: Multiple dietary modification options and treatment options were discussed and Jaleyah agreed to follow the recommendations documented in the above note.  ARRANGE: Esme was educated on the importance of frequent visits to treat obesity as outlined per CMS and USPSTF guidelines and agreed to schedule her next follow up appointment today.  Attestation Statements:   Reviewed by clinician on day of visit: allergies, medications, problem list, medical history, surgical history, family history, social history, and previous encounter notes.   I, Para March, am acting as transcriptionist for Coralie Common, MD.  I have reviewed the above documentation for accuracy  and completeness, and I agree with the above. - Jinny Blossom, MD

## 2020-03-19 DIAGNOSIS — M25512 Pain in left shoulder: Secondary | ICD-10-CM | POA: Diagnosis not present

## 2020-03-26 DIAGNOSIS — M25512 Pain in left shoulder: Secondary | ICD-10-CM | POA: Diagnosis not present

## 2020-03-31 DIAGNOSIS — M25512 Pain in left shoulder: Secondary | ICD-10-CM | POA: Diagnosis not present

## 2020-04-01 ENCOUNTER — Other Ambulatory Visit: Payer: Self-pay

## 2020-04-01 ENCOUNTER — Encounter (INDEPENDENT_AMBULATORY_CARE_PROVIDER_SITE_OTHER): Payer: Self-pay | Admitting: Family Medicine

## 2020-04-01 ENCOUNTER — Ambulatory Visit (INDEPENDENT_AMBULATORY_CARE_PROVIDER_SITE_OTHER): Payer: Medicare PPO | Admitting: Family Medicine

## 2020-04-01 VITALS — BP 116/77 | HR 65 | Temp 98.0°F | Ht 66.0 in | Wt 197.0 lb

## 2020-04-01 DIAGNOSIS — E669 Obesity, unspecified: Secondary | ICD-10-CM

## 2020-04-01 DIAGNOSIS — E559 Vitamin D deficiency, unspecified: Secondary | ICD-10-CM | POA: Diagnosis not present

## 2020-04-01 DIAGNOSIS — E7849 Other hyperlipidemia: Secondary | ICD-10-CM

## 2020-04-01 DIAGNOSIS — Z6831 Body mass index (BMI) 31.0-31.9, adult: Secondary | ICD-10-CM | POA: Diagnosis not present

## 2020-04-01 MED ORDER — CHOLECALCIFEROL 1.25 MG (50000 UT) PO TABS: 50000.0000 [IU] | ORAL_TABLET | ORAL | 0 refills | Status: DC

## 2020-04-02 DIAGNOSIS — H25812 Combined forms of age-related cataract, left eye: Secondary | ICD-10-CM | POA: Diagnosis not present

## 2020-04-02 DIAGNOSIS — H52222 Regular astigmatism, left eye: Secondary | ICD-10-CM | POA: Diagnosis not present

## 2020-04-02 LAB — LIPID PANEL WITH LDL/HDL RATIO
Cholesterol, Total: 254 mg/dL — ABNORMAL HIGH (ref 100–199)
HDL: 54 mg/dL (ref >39)
LDL Chol Calc (NIH): 175 mg/dL — ABNORMAL HIGH (ref 0–99)
LDL/HDL Ratio: 3.2 ratio (ref 0.0–3.2)
Triglycerides: 140 mg/dL (ref 0–149)
VLDL Cholesterol Cal: 25 mg/dL (ref 5–40)

## 2020-04-02 LAB — VITAMIN D 25 HYDROXY (VIT D DEFICIENCY, FRACTURES): Vit D, 25-Hydroxy: 66.3 ng/mL (ref 30.0–100.0)

## 2020-04-02 NOTE — Progress Notes (Signed)
Chief Complaint:   OBESITY [name] is here to discuss her progress with her obesity treatment plan along with follow-up of her obesity related diagnoses. [name] is on the Category 3 Plan and states she is following her eating plan approximately 80% of the time. [name] states she is walking and doing PT 5-60 minutes 7 times per week.  Today's visit was #: 15 Starting weight: 219 lbs Starting date: 06/19/2019 Today's weight: 197 lbs Today's date: 04/01/2020 Total lbs lost to date: 22 lbs Total lbs lost since last in-office visit: 1 lb  Interim History: Pt has cataract surgery tomorrow, which will be followed in 2 weeks by other eye cataract surgery. Pt does feel stuck at current weight. She is getting all food in. She does incorporate extra snacks like yasso fruit and food on plan. She has no plans to go anywhere or do anything as she is doing PT for knee and shoulder.  Subjective:   1. Vitamin D deficiency Pt denies nausea, vomiting, and muscle weakness but notes fatigue. Pt is on prescription Vit D.  2. Other hyperlipidemia Pt's last LDL was 145, HDL 43, and triglycerides 183. She is not on statin therapy.  Assessment/Plan:   1. Vitamin D deficiency Low Vitamin D level contributes to fatigue and are associated with obesity, breast, and colon cancer. She agrees to continue to take prescription Vitamin D @50 ,000 IU every week and will follow-up for routine testing of Vitamin D, at least 2-3 times per year to avoid over-replacement. Check labs today.  - Cholecalciferol 1.25 MG (50000 UT) TABS; Take 50,000 Units by mouth once a week.  Dispense: 4 tablet; Refill: 0  - VITAMIN D 25 Hydroxy (Vit-D Deficiency, Fractures)  2. Other hyperlipidemia Cardiovascular risk and specific lipid/LDL goals reviewed.  We discussed several lifestyle modifications today and [name] will continue to work on diet, exercise and weight loss efforts. Orders and follow up as documented in patient record. Check  labs today.  Counseling Intensive lifestyle modifications are the first line treatment for this issue. . Dietary changes: Increase soluble fiber. Decrease simple carbohydrates. . Exercise changes: Moderate to vigorous-intensity aerobic activity 150 minutes per week if tolerated. . Lipid-lowering medications: see documented in medical record. .  - Lipid Panel With LDL/HDL Ratio  3. Class 1 obesity with serious comorbidity and body mass index (BMI) of 31.0 to 31.9 in adult, unspecified obesity type [name] is currently in the action stage of change. As such, her goal is to continue with weight loss efforts. She has agreed to the Category 3 Plan and the [name].   Exercise goals: As is  Behavioral modification strategies: increasing lean protein intake, meal planning and cooking strategies, keeping healthy foods in the home and planning for success.  [name] has agreed to follow-up with our clinic in 3 weeks. She was informed of the importance of frequent follow-up visits to maximize her success with intensive lifestyle modifications for her multiple health conditions.   Objective:   Blood pressure 116/77, pulse 65, temperature 98 F (36.7 C), temperature source Oral, height 5\' 6"  (1.676 m), weight 197 lb (89.4 kg), SpO2 97 %. Body mass index is 31.8 kg/m.  General: Cooperative, alert, well developed, in no acute distress. HEENT: Conjunctivae and lids unremarkable. Cardiovascular: Regular rhythm.  Lungs: Normal work of breathing. Neurologic: No focal deficits.   Lab Results  Component Value Date   CREATININE 0.91 06/19/2019   BUN 16 06/19/2019   NA 141 06/19/2019  K 4.4 06/19/2019   CL 104 06/19/2019   CO2 23 06/19/2019   Lab Results  Component Value Date   ALT 15 06/19/2019   AST 25 06/19/2019   ALKPHOS 100 06/19/2019   BILITOT 0.4 06/19/2019   No results found for: HGBA1C No results found for: INSULIN Lab Results  Component Value Date   TSH  1.220 06/19/2019   Lab Results  Component Value Date   CHOL 254 (H) 04/01/2020   HDL 54 04/01/2020   LDLCALC 175 (H) 04/01/2020   LDLDIRECT 143.0 11/19/2018   TRIG 140 04/01/2020   CHOLHDL 6 11/19/2018   Lab Results  Component Value Date   WBC 8.7 06/19/2019   HGB 15.5 06/19/2019   HCT 47.5 (H) 06/19/2019   MCV 93 06/19/2019   PLT 276 06/19/2019   No results found for: IRON, TIBC, FERRITIN  Obesity Behavioral Intervention:   Approximately 15 minutes were spent on the discussion below.  ASK: We discussed the diagnosis of obesity with [name] today and [name] agreed to give [place] permission to discuss obesity behavioral modification therapy today.  ASSESS: [name] has the diagnosis of obesity and her BMI today is 31.9. [name] is in the action stage of change.   ADVISE: [name] was educated on the multiple health risks of obesity as well as the benefit of weight loss to improve her health. She was advised of the need for long term treatment and the importance of lifestyle modifications to improve her current health and to decrease her risk of future health problems.  AGREE: Multiple dietary modification options and treatment options were discussed and [name] agreed to follow the recommendations documented in the above note.  ARRANGE: Effa was educated on the importance of frequent visits to treat obesity as outlined per CMS and USPSTF guidelines and agreed to schedule her next follow up appointment today.  Attestation Statements:   Reviewed by clinician on day of visit: allergies, medications, problem list, medical history, surgical history, family history, social history, and previous encounter notes.  Coral Ceo, am acting as transcriptionist for Coralie Common, MD.   I have reviewed the above documentation for accuracy and completeness, and I agree with the above. - Jinny Blossom, MD

## 2020-04-08 DIAGNOSIS — H25811 Combined forms of age-related cataract, right eye: Secondary | ICD-10-CM | POA: Diagnosis not present

## 2020-04-09 DIAGNOSIS — M25561 Pain in right knee: Secondary | ICD-10-CM | POA: Diagnosis not present

## 2020-04-09 DIAGNOSIS — M25512 Pain in left shoulder: Secondary | ICD-10-CM | POA: Diagnosis not present

## 2020-04-09 DIAGNOSIS — M25661 Stiffness of right knee, not elsewhere classified: Secondary | ICD-10-CM | POA: Diagnosis not present

## 2020-04-12 NOTE — Progress Notes (Signed)
[name] T. Shelby Dutkiewicz, MD, CAQ Sports Medicine  Primary Care and Sports Medicine [facility] at [facility] [address] [place], [address]  Phone: [phone]  FAX: [phone]  [name] - 75 y.o. female  MRN [id number]  Date of Birth: [birth year]  Date: 04/13/2020  PCP: Karie Schwalbe, MD  Referral: Karie Schwalbe, MD  Chief Complaint  Patient presents with  . Knee Pain    Right    This visit occurred during the SARS-CoV-2 public health emergency.  Safety protocols were in place, including screening questions prior to the visit, additional usage of staff PPE, and extensive cleaning of exam room while observing appropriate contact time as indicated for disinfecting solutions.   Subjective:   [name] is a 75 y.o. very pleasant female patient with Body mass index is 32.72 kg/m. who presents with the following:  She is a very nice lady, and I saw her about 8 years ago for some hip pain.  She presents today for some ongoing knee pain.  R sided knee pain.  Not making any headway.  Will start laterally.  Feels stiff.  Physical therapist suggested that she come in for additional face-to-face evaluation.  She does not have any significant history of trauma, fracture, or operative intervention in the affected knee.  She notices primary pain with going up and down stairs.  She does have some stiffness after skating, and extended walks do also provoke pain.  She will also have some intermittent effusions with activity.  Doing PT daily. 2 time a week formally  Review of Systems is noted in the HPI, as appropriate   Objective:   BP 126/84   Pulse 67   Temp 98.3 F (36.8 C) (Temporal)   Ht 5\' 6"  (1.676 m)   Wt 202 lb 12 oz (92 kg)   SpO2 94%   BMI 32.72 kg/m   Right knee: Full extension and flexion to 100.  She does have a mild ballotable effusion.  Minimally tender with loading the medial lateral patellar facets.  She does have  some mild joint line tenderness medially and laterally.  Stable ACL, PCL, MCL, and LCL.  Bounce home testing does provoke some pain.  Any form of deep flexion causes significant pain including McMurray's test without mechanical symptoms.  She is neurovascularly intact throughout.  At the hip she does have some decreased strength in flexion and abduction at 4-4+/5 The remainder of the strength exam is 5/5  Radiology: DG Knee 4 Views W/Patella Right  Result Date: 04/14/2020 CLINICAL DATA:  Right knee pain EXAM: RIGHT KNEE - COMPLETE 4+ VIEW COMPARISON:  None. FINDINGS: No acute fracture or dislocation. Mild-to-moderate osteoarthritis in the lateral and patellofemoral compartments. No significant knee joint effusion. Soft tissues within normal limits. IMPRESSION: Mild to moderate osteoarthritis of the right knee. Electronically Signed   By: Duanne Guess D.O.   On: 04/14/2020 10:06     Assessment and Plan:     ICD-10-CM   1. Primary osteoarthritis of right knee  M17.11   2. Chronic pain of right knee  M25.561 DG Knee 4 Views W/Patella Right   G89.29    Acute on chronic right knee osteoarthritis with exacerbation.  We did review her films face-to-face, and there is notable degenerative changes throughout, and would also characterize this as mild to moderate and agree with radiology.  At this point, I think it working on motion primarily would be helpful.  Continue  with strengthening and PT.  Think is also reasonable to take some NSAIDs even daily if needed.  She can also combine this with some Tylenol.  At this point, given her pain level in the office, I do not think any additional intervention is needed.  Social: This is significantly limiting her ability to exercise  Orders Placed This Encounter  Procedures  . DG Knee 4 Views W/Patella Right    Follow-up: No follow-ups on file.  Signed,  Elpidio Galea. Jaiven Graveline, MD   Outpatient Encounter Medications as of 04/13/2020  Medication  Sig  . brimonidine-timolol (COMBIGAN) 0.2-0.5 % ophthalmic solution Place 1 drop into both eyes every 12 (twelve) hours.  . Cholecalciferol 1.25 MG (50000 UT) TABS Take 50,000 Units by mouth once a week.  Marland Kitchen PROLENSA 0.07 % SOLN Place 1 drop into the left eye daily.  . vitamin B-12 (CYANOCOBALAMIN) 1000 MCG tablet Take 1,000 mcg by mouth daily.   No facility-administered encounter medications on file as of 04/13/2020.

## 2020-04-13 ENCOUNTER — Other Ambulatory Visit: Payer: Self-pay

## 2020-04-13 ENCOUNTER — Encounter: Payer: Self-pay | Admitting: Family Medicine

## 2020-04-13 ENCOUNTER — Ambulatory Visit (INDEPENDENT_AMBULATORY_CARE_PROVIDER_SITE_OTHER)
Admission: RE | Admit: 2020-04-13 | Discharge: 2020-04-13 | Disposition: A | Discharge status: Home / Self Care | Payer: Medicare PPO | Source: Ambulatory Visit | Attending: Family Medicine | Admitting: Family Medicine

## 2020-04-13 ENCOUNTER — Ambulatory Visit (INDEPENDENT_AMBULATORY_CARE_PROVIDER_SITE_OTHER): Payer: Medicare PPO | Admitting: Family Medicine

## 2020-04-13 VITALS — BP 126/84 | HR 67 | Temp 98.3°F | Ht 66.0 in | Wt 202.8 lb

## 2020-04-13 DIAGNOSIS — G8929 Other chronic pain: Secondary | ICD-10-CM

## 2020-04-13 DIAGNOSIS — M1711 Unilateral primary osteoarthritis, right knee: Secondary | ICD-10-CM | POA: Diagnosis not present

## 2020-04-13 DIAGNOSIS — M25561 Pain in right knee: Secondary | ICD-10-CM

## 2020-04-14 DIAGNOSIS — M25512 Pain in left shoulder: Secondary | ICD-10-CM | POA: Diagnosis not present

## 2020-04-14 DIAGNOSIS — M25561 Pain in right knee: Secondary | ICD-10-CM | POA: Diagnosis not present

## 2020-04-14 DIAGNOSIS — M25661 Stiffness of right knee, not elsewhere classified: Secondary | ICD-10-CM | POA: Diagnosis not present

## 2020-04-16 DIAGNOSIS — H25811 Combined forms of age-related cataract, right eye: Secondary | ICD-10-CM | POA: Diagnosis not present

## 2020-04-16 DIAGNOSIS — H52221 Regular astigmatism, right eye: Secondary | ICD-10-CM | POA: Diagnosis not present

## 2020-04-21 DIAGNOSIS — M25561 Pain in right knee: Secondary | ICD-10-CM | POA: Diagnosis not present

## 2020-04-21 DIAGNOSIS — M25512 Pain in left shoulder: Secondary | ICD-10-CM | POA: Diagnosis not present

## 2020-04-21 DIAGNOSIS — M25661 Stiffness of right knee, not elsewhere classified: Secondary | ICD-10-CM | POA: Diagnosis not present

## 2020-04-23 DIAGNOSIS — M25661 Stiffness of right knee, not elsewhere classified: Secondary | ICD-10-CM | POA: Diagnosis not present

## 2020-04-23 DIAGNOSIS — M25561 Pain in right knee: Secondary | ICD-10-CM | POA: Diagnosis not present

## 2020-04-23 DIAGNOSIS — M25512 Pain in left shoulder: Secondary | ICD-10-CM | POA: Diagnosis not present

## 2020-04-27 ENCOUNTER — Ambulatory Visit (INDEPENDENT_AMBULATORY_CARE_PROVIDER_SITE_OTHER): Payer: Medicare PPO | Admitting: Family Medicine

## 2020-04-28 DIAGNOSIS — M25561 Pain in right knee: Secondary | ICD-10-CM | POA: Diagnosis not present

## 2020-04-28 DIAGNOSIS — M25661 Stiffness of right knee, not elsewhere classified: Secondary | ICD-10-CM | POA: Diagnosis not present

## 2020-04-28 DIAGNOSIS — M25512 Pain in left shoulder: Secondary | ICD-10-CM | POA: Diagnosis not present

## 2020-04-30 ENCOUNTER — Ambulatory Visit (INDEPENDENT_AMBULATORY_CARE_PROVIDER_SITE_OTHER): Payer: Medicare PPO | Admitting: Adult Health

## 2020-04-30 DIAGNOSIS — M25561 Pain in right knee: Secondary | ICD-10-CM | POA: Diagnosis not present

## 2020-04-30 DIAGNOSIS — M25512 Pain in left shoulder: Secondary | ICD-10-CM | POA: Diagnosis not present

## 2020-04-30 DIAGNOSIS — M25661 Stiffness of right knee, not elsewhere classified: Secondary | ICD-10-CM | POA: Diagnosis not present

## 2020-05-05 DIAGNOSIS — M25561 Pain in right knee: Secondary | ICD-10-CM | POA: Diagnosis not present

## 2020-05-05 DIAGNOSIS — M25661 Stiffness of right knee, not elsewhere classified: Secondary | ICD-10-CM | POA: Diagnosis not present

## 2020-05-05 DIAGNOSIS — M25512 Pain in left shoulder: Secondary | ICD-10-CM | POA: Diagnosis not present

## 2020-05-11 ENCOUNTER — Encounter (INDEPENDENT_AMBULATORY_CARE_PROVIDER_SITE_OTHER): Payer: Self-pay | Admitting: Family Medicine

## 2020-05-11 ENCOUNTER — Other Ambulatory Visit: Payer: Self-pay

## 2020-05-11 ENCOUNTER — Ambulatory Visit (INDEPENDENT_AMBULATORY_CARE_PROVIDER_SITE_OTHER): Payer: Medicare PPO | Admitting: Family Medicine

## 2020-05-11 VITALS — BP 121/76 | HR 75 | Temp 98.5°F | Ht 66.0 in | Wt 200.0 lb

## 2020-05-11 DIAGNOSIS — E7849 Other hyperlipidemia: Secondary | ICD-10-CM

## 2020-05-11 DIAGNOSIS — Z6835 Body mass index (BMI) 35.0-35.9, adult: Secondary | ICD-10-CM

## 2020-05-11 DIAGNOSIS — E559 Vitamin D deficiency, unspecified: Secondary | ICD-10-CM

## 2020-05-13 NOTE — Progress Notes (Signed)
Chief Complaint:   OBESITY [name] is here to discuss her progress with her obesity treatment plan along with follow-up of her obesity related diagnoses. [name] is on the Category 3 Plan and states she is following her eating plan approximately 80% of the time. [name] states she is doing PT 60 minutes 2 times per week.  Today's visit was #: 35 Starting weight: 219 lbs Starting date: 06/19/2019 Today's weight: 200 lbs Today's date: 05/11/2020 Total lbs lost to date: 19 lbs Total lbs lost since last in-office visit: 0  Interim History: [name] is still doing PT and had some conflicts in the past few appointments. She has been compliant on Category 3 but has increased carbohydrates in diet. Crackers have been her source of indulgence. She has realized the crackers are handy and convenient.  Subjective:   1. Vitamin D deficiency [name]'s last Vit D level 66.3. She was previously on prescription Vit D previously.  2. Other hyperlipidemia [name]'s LDL was elevated at last draw to 175, HDL 54, and triglycerides 140. She is not on statin therapy.  Her 10 year ASCVD risk is 12.2% (moderate intensity statin encouraged.)  Lab Results  Component Value Date   CHOL 254 (H) 04/01/2020   HDL 54 04/01/2020   LDLCALC 175 (H) 04/01/2020   LDLDIRECT 143.0 11/19/2018   TRIG 140 04/01/2020   CHOLHDL 6 11/19/2018   Lab Results  Component Value Date   ALT 15 06/19/2019   AST 25 06/19/2019   ALKPHOS 100 06/19/2019   BILITOT 0.4 06/19/2019   The 10-year ASCVD risk score [name], et al., 2013) is: 12.3%   Values used to calculate the score:     Age: 75 years     Sex: Female     Is Non-Hispanic African American: No     Diabetic: No     Tobacco smoker: No     Systolic Blood Pressure: 193 mmHg     Is BP treated: No     HDL Cholesterol: 54 mg/dL     Total Cholesterol: 254 mg/dL   Assessment/Plan:   1. Vitamin D deficiency Low Vitamin D level contributes to fatigue and are associated with  obesity, breast, and colon cancer. She agrees to decrease to take OTC Vitamin D @2 ,000 IU daily and will follow-up for routine testing of Vitamin D, at least 2-3 times per year to avoid over-replacement. She is at goal.  2. Other hyperlipidemia Cardiovascular risk and specific lipid/LDL goals reviewed.  We discussed several lifestyle modifications today and [name] will continue to work on diet, exercise and weight loss efforts. Orders and follow up as documented in patient record. Repeat labs in June.  Counseling Intensive lifestyle modifications are the first line treatment for this issue. . Dietary changes: Increase soluble fiber. Decrease simple carbohydrates. . Exercise changes: Moderate to vigorous-intensity aerobic activity 150 minutes per week if tolerated. . Lipid-lowering medications: see documented in medical record.  3. Obesity with current BMI of 79 [name] is currently in the action stage of change. As such, her goal is to continue with weight loss efforts. She has agreed to the Category 3 Plan.   Exercise goals: As is  Behavioral modification strategies: increasing lean protein intake, meal planning and cooking strategies, keeping healthy foods in the home and planning for success.  [name] has agreed to follow-up with our clinic in 3 weeks. She was informed of the importance of frequent follow-up visits to maximize her success with intensive lifestyle modifications  for her multiple health conditions.   Objective:   Blood pressure 121/76, pulse 75, temperature 98.5 F (36.9 C), temperature source Oral, height 5\' 6"  (1.676 m), weight 200 lb (90.7 kg), SpO2 98 %. Body mass index is 32.28 kg/m.  General: Cooperative, alert, well developed, in no acute distress. HEENT: Conjunctivae and lids unremarkable. Cardiovascular: Regular rhythm.  Lungs: Normal work of breathing. Neurologic: No focal deficits.   Lab Results  Component Value Date   CREATININE 0.91 06/19/2019   BUN 16  06/19/2019   NA 141 06/19/2019   K 4.4 06/19/2019   CL 104 06/19/2019   CO2 23 06/19/2019   Lab Results  Component Value Date   ALT 15 06/19/2019   AST 25 06/19/2019   ALKPHOS 100 06/19/2019   BILITOT 0.4 06/19/2019   No results found for: HGBA1C No results found for: INSULIN Lab Results  Component Value Date   TSH 1.220 06/19/2019   Lab Results  Component Value Date   CHOL 254 (H) 04/01/2020   HDL 54 04/01/2020   LDLCALC 175 (H) 04/01/2020   LDLDIRECT 143.0 11/19/2018   TRIG 140 04/01/2020   CHOLHDL 6 11/19/2018   Lab Results  Component Value Date   WBC 8.7 06/19/2019   HGB 15.5 06/19/2019   HCT 47.5 (H) 06/19/2019   MCV 93 06/19/2019   PLT 276 06/19/2019   No results found for: IRON, TIBC, FERRITIN  Obesity Behavioral Intervention:   Approximately 15 minutes were spent on the discussion below.  ASK: We discussed the diagnosis of obesity with [name] today and [name] agreed to give [place] permission to discuss obesity behavioral modification therapy today.  ASSESS: [name] has the diagnosis of obesity and her BMI today is 32.4. [name] is in the action stage of change.   ADVISE: [name] was educated on the multiple health risks of obesity as well as the benefit of weight loss to improve her health. She was advised of the need for long term treatment and the importance of lifestyle modifications to improve her current health and to decrease her risk of future health problems.  AGREE: Multiple dietary modification options and treatment options were discussed and [name] agreed to follow the recommendations documented in the above note.  ARRANGE: [name] was educated on the importance of frequent visits to treat obesity as outlined per CMS and USPSTF guidelines and agreed to schedule her next follow up appointment today.  Attestation Statements:   Reviewed by clinician on day of visit: allergies, medications, problem list, medical history, surgical history, family history,  social history, and previous encounter notes.  Coral Ceo, am acting as transcriptionist for Coralie Common, MD.   I have reviewed the above documentation for accuracy and completeness, and I agree with the above. - Jinny Blossom, MD

## 2020-05-21 ENCOUNTER — Encounter (INDEPENDENT_AMBULATORY_CARE_PROVIDER_SITE_OTHER): Payer: Self-pay | Admitting: Family Medicine

## 2020-05-21 DIAGNOSIS — M25512 Pain in left shoulder: Secondary | ICD-10-CM | POA: Diagnosis not present

## 2020-05-21 DIAGNOSIS — M25561 Pain in right knee: Secondary | ICD-10-CM | POA: Diagnosis not present

## 2020-05-21 DIAGNOSIS — M25661 Stiffness of right knee, not elsewhere classified: Secondary | ICD-10-CM | POA: Diagnosis not present

## 2020-05-26 DIAGNOSIS — M25661 Stiffness of right knee, not elsewhere classified: Secondary | ICD-10-CM | POA: Diagnosis not present

## 2020-05-26 DIAGNOSIS — M25512 Pain in left shoulder: Secondary | ICD-10-CM | POA: Diagnosis not present

## 2020-05-26 DIAGNOSIS — M25561 Pain in right knee: Secondary | ICD-10-CM | POA: Diagnosis not present

## 2020-05-26 DIAGNOSIS — M1711 Unilateral primary osteoarthritis, right knee: Secondary | ICD-10-CM | POA: Diagnosis not present

## 2020-06-02 DIAGNOSIS — M25561 Pain in right knee: Secondary | ICD-10-CM | POA: Diagnosis not present

## 2020-06-02 DIAGNOSIS — M25661 Stiffness of right knee, not elsewhere classified: Secondary | ICD-10-CM | POA: Diagnosis not present

## 2020-06-02 DIAGNOSIS — M25512 Pain in left shoulder: Secondary | ICD-10-CM | POA: Diagnosis not present

## 2020-06-03 ENCOUNTER — Ambulatory Visit (INDEPENDENT_AMBULATORY_CARE_PROVIDER_SITE_OTHER): Payer: Medicare PPO | Admitting: Family Medicine

## 2020-09-21 DIAGNOSIS — H401131 Primary open-angle glaucoma, bilateral, mild stage: Secondary | ICD-10-CM | POA: Diagnosis not present

## 2020-11-16 DIAGNOSIS — Z23 Encounter for immunization: Secondary | ICD-10-CM | POA: Diagnosis not present

## 2020-11-24 ENCOUNTER — Encounter: Payer: Medicare PPO | Admitting: Internal Medicine

## 2020-12-01 ENCOUNTER — Encounter: Payer: Medicare PPO | Admitting: Internal Medicine

## 2021-01-21 DIAGNOSIS — H401131 Primary open-angle glaucoma, bilateral, mild stage: Secondary | ICD-10-CM | POA: Diagnosis not present

## 2021-01-21 DIAGNOSIS — Z961 Presence of intraocular lens: Secondary | ICD-10-CM | POA: Diagnosis not present

## 2021-02-16 DIAGNOSIS — Z1283 Encounter for screening for malignant neoplasm of skin: Secondary | ICD-10-CM | POA: Diagnosis not present

## 2021-02-16 DIAGNOSIS — L814 Other melanin hyperpigmentation: Secondary | ICD-10-CM | POA: Diagnosis not present

## 2021-02-16 DIAGNOSIS — L719 Rosacea, unspecified: Secondary | ICD-10-CM | POA: Diagnosis not present

## 2021-02-16 DIAGNOSIS — L918 Other hypertrophic disorders of the skin: Secondary | ICD-10-CM | POA: Diagnosis not present

## 2021-02-24 ENCOUNTER — Encounter: Payer: Medicare PPO | Admitting: Internal Medicine

## 2021-03-09 ENCOUNTER — Ambulatory Visit (INDEPENDENT_AMBULATORY_CARE_PROVIDER_SITE_OTHER): Payer: Medicare PPO | Admitting: Internal Medicine

## 2021-03-09 ENCOUNTER — Other Ambulatory Visit: Payer: Self-pay

## 2021-03-09 ENCOUNTER — Encounter: Payer: Self-pay | Admitting: Internal Medicine

## 2021-03-09 VITALS — BP 138/90 | HR 66 | Temp 97.6°F | Ht 66.0 in | Wt 216.0 lb

## 2021-03-09 DIAGNOSIS — R350 Frequency of micturition: Secondary | ICD-10-CM | POA: Diagnosis not present

## 2021-03-09 DIAGNOSIS — E538 Deficiency of other specified B group vitamins: Secondary | ICD-10-CM | POA: Diagnosis not present

## 2021-03-09 DIAGNOSIS — E669 Obesity, unspecified: Secondary | ICD-10-CM

## 2021-03-09 DIAGNOSIS — Z0000 Encounter for general adult medical examination without abnormal findings: Secondary | ICD-10-CM | POA: Diagnosis not present

## 2021-03-09 DIAGNOSIS — Z1211 Encounter for screening for malignant neoplasm of colon: Secondary | ICD-10-CM | POA: Diagnosis not present

## 2021-03-09 DIAGNOSIS — N3000 Acute cystitis without hematuria: Secondary | ICD-10-CM | POA: Insufficient documentation

## 2021-03-09 DIAGNOSIS — K219 Gastro-esophageal reflux disease without esophagitis: Secondary | ICD-10-CM

## 2021-03-09 DIAGNOSIS — E785 Hyperlipidemia, unspecified: Secondary | ICD-10-CM

## 2021-03-09 LAB — POC URINALSYSI DIPSTICK (AUTOMATED)
Bilirubin, UA: NEGATIVE
Blood, UA: NEGATIVE
Glucose, UA: NEGATIVE
Ketones, UA: NEGATIVE
Nitrite, UA: NEGATIVE
Protein, UA: POSITIVE — AB
Spec Grav, UA: 1.025 (ref 1.010–1.025)
Urobilinogen, UA: 0.2 E.U./dL
pH, UA: 6 (ref 5.0–8.0)

## 2021-03-09 LAB — CBC
HCT: 42.4 % (ref 36.0–46.0)
Hemoglobin: 14.1 g/dL (ref 12.0–15.0)
MCHC: 33.2 g/dL (ref 30.0–36.0)
MCV: 90.6 fl (ref 78.0–100.0)
Platelets: 227 10*3/uL (ref 150.0–400.0)
RBC: 4.69 Mil/uL (ref 3.87–5.11)
RDW: 12.8 % (ref 11.5–15.5)
WBC: 7 10*3/uL (ref 4.0–10.5)

## 2021-03-09 LAB — LIPID PANEL
Cholesterol: 230 mg/dL — ABNORMAL HIGH (ref 0–200)
HDL: 52.1 mg/dL (ref >39.00)
LDL Cholesterol: 142 mg/dL — ABNORMAL HIGH (ref 0–99)
NonHDL: 178.01
Total CHOL/HDL Ratio: 4
Triglycerides: 178 mg/dL — ABNORMAL HIGH (ref 0.0–149.0)
VLDL: 35.6 mg/dL (ref 0.0–40.0)

## 2021-03-09 LAB — COMPREHENSIVE METABOLIC PANEL
ALT: 11 U/L (ref 0–35)
AST: 21 U/L (ref 0–37)
Albumin: 4 g/dL (ref 3.5–5.2)
Alkaline Phosphatase: 70 U/L (ref 39–117)
BUN: 16 mg/dL (ref 6–23)
CO2: 30 mEq/L (ref 19–32)
Calcium: 9.3 mg/dL (ref 8.4–10.5)
Chloride: 104 mEq/L (ref 96–112)
Creatinine, Ser: 0.84 mg/dL (ref 0.40–1.20)
GFR: 68.42 mL/min (ref >60.00)
Glucose, Bld: 88 mg/dL (ref 70–99)
Potassium: 4.4 mEq/L (ref 3.5–5.1)
Sodium: 140 mEq/L (ref 135–145)
Total Bilirubin: 0.5 mg/dL (ref 0.2–1.2)
Total Protein: 6.6 g/dL (ref 6.0–8.3)

## 2021-03-09 LAB — VITAMIN B12: Vitamin B-12: 700 pg/mL (ref 211–911)

## 2021-03-09 MED ORDER — SULFAMETHOXAZOLE-TRIMETHOPRIM 800-160 MG PO TABS: 1.0000 | ORAL_TABLET | Freq: Two times a day (BID) | ORAL | 1 refills | Status: DC

## 2021-03-09 MED ORDER — FAMOTIDINE 20 MG PO TABS: 20.0000 mg | ORAL_TABLET | Freq: Every day | ORAL | 3 refills | Status: DC

## 2021-03-09 NOTE — Patient Instructions (Signed)
I would recommend you look into the [employer].

## 2021-03-09 NOTE — Assessment & Plan Note (Signed)
Discussed options---recommended low carb diet

## 2021-03-09 NOTE — Assessment & Plan Note (Signed)
Continues on the B12 sulbingual supplement

## 2021-03-09 NOTE — Assessment & Plan Note (Signed)
Moderately elevated Doesn't want primary prevention with statin

## 2021-03-09 NOTE — Assessment & Plan Note (Signed)
I have personally reviewed the Medicare Annual Wellness questionnaire and have noted 1. The patient's medical and social history 2. Their use of alcohol, tobacco or illicit drugs 3. Their current medications and supplements 4. The patient's functional ability including ADL's, fall risks, home safety risks and hearing or visual             impairment. 5. Diet and physical activities 6. Evidence for depression or mood disorders  The patients weight, height, BMI and visual acuity have been recorded in the chart I have made referrals, counseling and provided education to the patient based review of the above and I have provided the pt with a written personalized care plan for preventive services.  I have provided you with a copy of your personalized plan for preventive services. Please take the time to review along with your updated medication list.  Will do FIT again Mammogram every other year --later this year (probably till 10) No pap due to age UTD on all imms Exercises regularly

## 2021-03-09 NOTE — Assessment & Plan Note (Signed)
Urinalysis shows 1+ leuks Fairly classic symptoms Will try 3 days of septra DS---if recurs or fails to respond, will check culture

## 2021-03-09 NOTE — Progress Notes (Signed)
Hearing Screening - Comments:: Has hearing aids. Wearing them today. Vision Screening - Comments:: December 2022

## 2021-03-09 NOTE — Progress Notes (Signed)
Subjective:    Patient ID: [name], female    DOB: 11/23/1945, 76 y.o.   MRN: [id number]  HPI Here for Medicare wellness visit and follow up of chronic health conditions Reviewed form and advanced directives Reviewed other doctors No hospitalizations this past year Did have cataract surgery [facility]----vision is much better Exercises regularly--has personal trainer Hearing aides do help No falls No depression or anhedonia Independent with instrumental ADLs No sig memory problems  Having some urinary frequency Thinks she has an infection Had stomach bug a while ago--thinks it came from that No fever Mild dysuria Some nocturia--not really worse  Not doing the wellness visits still Didn't care for the doctor there Has gained back 20#----BMI 34+ now  Continues on sublingual B12 No sig neuropathy--no burning, numbness, etc  Known high cholesterol  Prefers no statin for primary prevention  Continues on drops for glaucoma  Current Outpatient Medications on File Prior to Visit  Medication Sig Dispense Refill   brimonidine-timolol (COMBIGAN) 0.2-0.5 % ophthalmic solution Place 1 drop into both eyes every 12 (twelve) hours.     vitamin B-12 (CYANOCOBALAMIN) 1000 MCG tablet Take 1,000 mcg by mouth daily.     No current facility-administered medications on file prior to visit.    No Known Allergies  Past Medical History:  Diagnosis Date   B12 deficiency    BPPV (benign paroxysmal positional vertigo)    Endometrial cancer ([place]) 2010   s/p hysterectomy   Gallbladder problem    Glaucoma    Dr Gaspar Bidding [facility])   Neuropathy     Past Surgical History:  Procedure Laterality Date   ABDOMINAL HYSTERECTOMY  2010   Endometrial Ca   CHOLECYSTECTOMY  2010   OOPHORECTOMY Bilateral    TONSILLECTOMY      Family History  Problem Relation Age of Onset   Cancer Father        colon   Heart disease Neg Hx    Diabetes Neg Hx    Breast cancer Neg Hx     Social History    Socioeconomic History   Marital status: Single    Spouse name: Not on file   Number of children: 1   Years of education: Not on file   Highest education level: Not on file  Occupational History   Occupation: Radio producer    Comment: Retired  Tobacco Use   Smoking status: Never   Smokeless tobacco: Never  Substance and Sexual Activity   Alcohol use: Yes    Comment: Rarely   Drug use: No   Sexual activity: Not on file  Other Topics Concern   Not on file  Social History Narrative   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      Has living will   Has health care POA-----sister Tressie Stalker (or son--who lives far away though)   Would accept resuscitation   Hasn't considered tube feeds      Social Determinants of Health   Financial Resource Strain: Not on file  Food Insecurity: Not on file  Transportation Needs: Not on file  Physical Activity: Not on file  Stress: Not on file  Social Connections: Not on file  Intimate Partner Violence: Not on file   Review of Systems Mild sleep issues Wears seat belt Teeth are okay--keeps up with dentist No suspicious skin lesions. Has rosacea--compounded cream Some heartburn--tums helps (sometimes nightly). No dysphagia. Doesn't eat close to bedtime Bowels fine---no blood No urinary incontinence No sig back or joint pains. Occasional  left knee issues--better now. Did see ortho--but no action needed No chest pain or SOB No dizziness or syncope    Objective:   Physical Exam Constitutional:      Appearance: Normal appearance.  HENT:     Mouth/Throat:     Comments: No lesions Eyes:     Conjunctiva/sclera: Conjunctivae normal.     Pupils: Pupils are equal, round, and reactive to light.  Cardiovascular:     Rate and Rhythm: Normal rate and regular rhythm.     Pulses: Normal pulses.     Heart sounds: No murmur heard.   No gallop.  Pulmonary:     Effort: Pulmonary effort is normal.     Breath sounds: Normal breath sounds. No  wheezing or rales.  Abdominal:     Palpations: Abdomen is soft.     Tenderness: There is no abdominal tenderness.  Musculoskeletal:     Cervical back: Neck supple.     Right lower leg: No edema.     Left lower leg: No edema.  Lymphadenopathy:     Cervical: No cervical adenopathy.  Skin:    Findings: No lesion or rash.  Neurological:     General: No focal deficit present.     Mental Status: She is alert and oriented to person, place, and time.  Psychiatric:        Mood and Affect: Mood normal.        Behavior: Behavior normal.           Assessment & Plan:

## 2021-03-09 NOTE — Assessment & Plan Note (Signed)
Has been using tums frequently Will start famotidine 20mg  nightly at bedtime

## 2021-03-14 ENCOUNTER — Encounter: Payer: Self-pay | Admitting: Internal Medicine

## 2021-03-16 ENCOUNTER — Other Ambulatory Visit (INDEPENDENT_AMBULATORY_CARE_PROVIDER_SITE_OTHER): Payer: Medicare PPO

## 2021-03-16 DIAGNOSIS — Z1211 Encounter for screening for malignant neoplasm of colon: Secondary | ICD-10-CM

## 2021-03-16 LAB — FECAL OCCULT BLOOD, IMMUNOCHEMICAL: Fecal Occult Bld: NEGATIVE

## 2021-03-19 MED ORDER — NITROFURANTOIN MONOHYD MACRO 100 MG PO CAPS: 100.0000 mg | ORAL_CAPSULE | Freq: Two times a day (BID) | ORAL | 0 refills | Status: DC

## 2021-05-06 DIAGNOSIS — D104 Benign neoplasm of tonsil: Secondary | ICD-10-CM | POA: Diagnosis not present

## 2021-08-09 ENCOUNTER — Encounter: Payer: Self-pay | Admitting: Internal Medicine

## 2021-08-26 DIAGNOSIS — H04123 Dry eye syndrome of bilateral lacrimal glands: Secondary | ICD-10-CM | POA: Diagnosis not present

## 2021-08-26 DIAGNOSIS — H401131 Primary open-angle glaucoma, bilateral, mild stage: Secondary | ICD-10-CM | POA: Diagnosis not present

## 2021-08-26 DIAGNOSIS — Z961 Presence of intraocular lens: Secondary | ICD-10-CM | POA: Diagnosis not present

## 2021-08-31 ENCOUNTER — Ambulatory Visit: Payer: Medicare PPO | Admitting: Internal Medicine

## 2021-09-22 ENCOUNTER — Encounter (INDEPENDENT_AMBULATORY_CARE_PROVIDER_SITE_OTHER): Payer: Self-pay

## 2021-10-27 ENCOUNTER — Encounter: Payer: Self-pay | Admitting: Internal Medicine

## 2021-10-27 ENCOUNTER — Ambulatory Visit: Payer: Medicare PPO | Admitting: Internal Medicine

## 2021-10-27 DIAGNOSIS — L304 Erythema intertrigo: Secondary | ICD-10-CM

## 2021-10-27 MED ORDER — KETOCONAZOLE 2 % EX CREA: 1.0000 | TOPICAL_CREAM | Freq: Two times a day (BID) | CUTANEOUS | 1 refills | Status: DC | PRN

## 2021-10-27 MED ORDER — FLUCONAZOLE 150 MG PO TABS: 150.0000 mg | ORAL_TABLET | ORAL | 1 refills | Status: DC

## 2021-10-27 NOTE — Assessment & Plan Note (Signed)
Does look fungal Will give ketoconazole cream--continue the powder Will give fluconazole for weekly use prn

## 2021-10-27 NOTE — Progress Notes (Signed)
   Subjective:    Patient ID: [name], female    DOB: 05-Dec-1945, 76 y.o.   MRN: [id number]  HPI Here due to a rash in groin  Rash in right inguinal skin folds Started in past month Medicated powder does dry it out--and takes away the itching Clotrimazole cream---didn't help Very itchy  Current Outpatient Medications on File Prior to Visit  Medication Sig Dispense Refill   brimonidine-timolol (COMBIGAN) 0.2-0.5 % ophthalmic solution Place 1 drop into both eyes every 12 (twelve) hours.     famotidine (PEPCID) 20 MG tablet Take 1 tablet (20 mg total) by mouth at bedtime. 90 tablet 3   vitamin B-12 (CYANOCOBALAMIN) 1000 MCG tablet Take 1,000 mcg by mouth daily.     No current facility-administered medications on file prior to visit.    No Known Allergies  Past Medical History:  Diagnosis Date   B12 deficiency    BPPV (benign paroxysmal positional vertigo)    Endometrial cancer ([place]) 2010   s/p hysterectomy   Gallbladder problem    Glaucoma    Dr Gaspar Bidding [facility])   Neuropathy     Past Surgical History:  Procedure Laterality Date   ABDOMINAL HYSTERECTOMY  2010   Endometrial Ca   CHOLECYSTECTOMY  2010   OOPHORECTOMY Bilateral    TONSILLECTOMY      Family History  Problem Relation Age of Onset   Cancer Father        colon   Heart disease Neg Hx    Diabetes Neg Hx    Breast cancer Neg Hx     Social History   Socioeconomic History   Marital status: Single    Spouse name: Not on file   Number of children: 1   Years of education: Not on file   Highest education level: Not on file  Occupational History   Occupation: Radio producer    Comment: Retired  Tobacco Use   Smoking status: Never   Smokeless tobacco: Never  Substance and Sexual Activity   Alcohol use: Yes    Comment: Rarely   Drug use: No   Sexual activity: Not on file  Other Topics Concern   Not on file  Social History Narrative   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      Has living  will   Has health care POA-----sister Tressie Stalker (or son--who lives far away though)   Would accept resuscitation   Hasn't considered tube feeds      Social Determinants of Health   Financial Resource Strain: Not on file  Food Insecurity: Not on file  Transportation Needs: Not on file  Physical Activity: Not on file  Stress: Not on file  Social Connections: Not on file  Intimate Partner Violence: Not on file   Review of Systems  Not sick  No antibiotics     Objective:   Physical Exam Constitutional:      Appearance: Normal appearance.  Skin:    Comments: Has localized classic fungal rash (round like) in mid right inguinal area            Assessment & Plan:

## 2021-11-10 ENCOUNTER — Encounter: Payer: Self-pay | Admitting: Internal Medicine

## 2021-11-17 DIAGNOSIS — B356 Tinea cruris: Secondary | ICD-10-CM | POA: Diagnosis not present

## 2021-11-17 DIAGNOSIS — B354 Tinea corporis: Secondary | ICD-10-CM | POA: Diagnosis not present

## 2021-12-15 DIAGNOSIS — R21 Rash and other nonspecific skin eruption: Secondary | ICD-10-CM | POA: Diagnosis not present

## 2022-01-02 ENCOUNTER — Encounter: Payer: Self-pay | Admitting: Internal Medicine

## 2022-01-03 MED ORDER — FAMOTIDINE 20 MG PO TABS: 20.0000 mg | ORAL_TABLET | Freq: Every day | ORAL | 3 refills | Status: DC

## 2022-01-19 DIAGNOSIS — B354 Tinea corporis: Secondary | ICD-10-CM | POA: Diagnosis not present

## 2022-01-19 DIAGNOSIS — B356 Tinea cruris: Secondary | ICD-10-CM | POA: Diagnosis not present

## 2022-01-24 ENCOUNTER — Encounter: Payer: Self-pay | Admitting: Internal Medicine

## 2022-01-24 ENCOUNTER — Other Ambulatory Visit: Payer: Self-pay

## 2022-01-24 ENCOUNTER — Ambulatory Visit: Payer: Medicare PPO | Admitting: Internal Medicine

## 2022-01-24 VITALS — BP 132/88 | HR 88 | Temp 97.0°F | Ht 66.0 in | Wt 213.0 lb

## 2022-01-24 DIAGNOSIS — J22 Unspecified acute lower respiratory infection: Secondary | ICD-10-CM | POA: Diagnosis not present

## 2022-01-24 MED ORDER — PREDNISONE 20 MG PO TABS
40.0000 mg | ORAL_TABLET | Freq: Every day | ORAL | 0 refills | Status: DC
  Filled 2022-01-24: qty 9, 4d supply, fill #0

## 2022-01-24 MED ORDER — ALBUTEROL SULFATE HFA 108 (90 BASE) MCG/ACT IN AERS
2.0000 | INHALATION_SPRAY | Freq: Four times a day (QID) | RESPIRATORY_TRACT | 0 refills | Status: DC | PRN
  Filled 2022-01-24: qty 6.7, 30d supply, fill #0

## 2022-01-24 MED ORDER — OPTICHAMBER DIAMOND MISC
0 refills | Status: DC
  Filled 2022-01-24: qty 1, 1d supply, fill #0
  Filled 2022-01-24: qty 1, 30d supply, fill #0

## 2022-01-24 NOTE — Progress Notes (Signed)
Subjective:    Patient ID: [name], female    DOB: 1945-04-08, 76 y.o.   MRN: [id number]  HPI Here due to persistent respiratory symptoms  Started a week ago with horrible cough Trouble getting SOB with the cough--and abdomen seemed to be "spasming" Cough not as bad but persistent Has to sit up to sleep No clear cut fever---slight chills earlier Some sore throat--seems to be from the cough Some headache--maxillary No ear pain  Tylenol/advil for this Nothing specific for the cough  Current Outpatient Medications on File Prior to Visit  Medication Sig Dispense Refill   brimonidine-timolol (COMBIGAN) 0.2-0.5 % ophthalmic solution Place 1 drop into both eyes every 12 (twelve) hours.     famotidine (PEPCID) 20 MG tablet Take 1 tablet (20 mg total) by mouth at bedtime. 90 tablet 3   vitamin B-12 (CYANOCOBALAMIN) 1000 MCG tablet Take 1,000 mcg by mouth daily.     No current facility-administered medications on file prior to visit.    No Known Allergies  Past Medical History:  Diagnosis Date   B12 deficiency    BPPV (benign paroxysmal positional vertigo)    Endometrial cancer ([place]) 2010   s/p hysterectomy   Gallbladder problem    Glaucoma    Dr Gaspar Bidding [facility])   Neuropathy     Past Surgical History:  Procedure Laterality Date   ABDOMINAL HYSTERECTOMY  2010   Endometrial Ca   CHOLECYSTECTOMY  2010   OOPHORECTOMY Bilateral    TONSILLECTOMY      Family History  Problem Relation Age of Onset   Cancer Father        colon   Heart disease Neg Hx    Diabetes Neg Hx    Breast cancer Neg Hx     Social History   Socioeconomic History   Marital status: Single    Spouse name: Not on file   Number of children: 1   Years of education: Not on file   Highest education level: Not on file  Occupational History   Occupation: Radio producer    Comment: Retired  Tobacco Use   Smoking status: Never    Passive exposure: Never   Smokeless tobacco: Never  Substance  and Sexual Activity   Alcohol use: Yes    Comment: Rarely   Drug use: No   Sexual activity: Not on file  Other Topics Concern   Not on file  Social History Narrative   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      Has living will   Has health care POA-----sister Tressie Stalker (or son--who lives far away though)   Would accept resuscitation   Hasn't considered tube feeds      Social Determinants of Health   Financial Resource Strain: Not on file  Food Insecurity: Not on file  Transportation Needs: Not on file  Physical Activity: Not on file  Stress: Not on file  Social Connections: Not on file  Intimate Partner Violence: Not on file   Review of Systems No N/V Eating fair     Objective:   Physical Exam Constitutional:      Appearance: Normal appearance.  HENT:     Head:     Comments: No sinus tenderness    Right Ear: Tympanic membrane and ear canal normal.     Left Ear: Tympanic membrane and ear canal normal.     Nose: Congestion present.     Mouth/Throat:     Pharynx: No oropharyngeal exudate or posterior oropharyngeal  erythema.  Pulmonary:     Effort: Pulmonary effort is normal.     Breath sounds: No rales.     Comments: Slight prolonged expiratory phase and wheezes Musculoskeletal:     Cervical back: Neck supple.  Lymphadenopathy:     Cervical: No cervical adenopathy.  Neurological:     Mental Status: She is alert.            Assessment & Plan:

## 2022-01-24 NOTE — Assessment & Plan Note (Signed)
Likely RSV No lingering systemic symptoms--but still coughing Will try a few days of prednisone Albuterol HFA--discussed spacer

## 2022-03-10 ENCOUNTER — Encounter: Payer: Self-pay | Admitting: Internal Medicine

## 2022-03-10 ENCOUNTER — Ambulatory Visit (INDEPENDENT_AMBULATORY_CARE_PROVIDER_SITE_OTHER): Payer: Medicare PPO | Admitting: Internal Medicine

## 2022-03-10 VITALS — BP 132/88 | HR 74 | Temp 97.6°F | Ht 65.5 in | Wt 212.0 lb

## 2022-03-10 DIAGNOSIS — E538 Deficiency of other specified B group vitamins: Secondary | ICD-10-CM | POA: Diagnosis not present

## 2022-03-10 DIAGNOSIS — Z23 Encounter for immunization: Secondary | ICD-10-CM | POA: Diagnosis not present

## 2022-03-10 DIAGNOSIS — E785 Hyperlipidemia, unspecified: Secondary | ICD-10-CM | POA: Diagnosis not present

## 2022-03-10 DIAGNOSIS — Z1211 Encounter for screening for malignant neoplasm of colon: Secondary | ICD-10-CM

## 2022-03-10 DIAGNOSIS — M7061 Trochanteric bursitis, right hip: Secondary | ICD-10-CM | POA: Diagnosis not present

## 2022-03-10 DIAGNOSIS — K219 Gastro-esophageal reflux disease without esophagitis: Secondary | ICD-10-CM

## 2022-03-10 DIAGNOSIS — Z0000 Encounter for general adult medical examination without abnormal findings: Secondary | ICD-10-CM | POA: Diagnosis not present

## 2022-03-10 NOTE — Assessment & Plan Note (Signed)
Still prefers no statin Will recheck

## 2022-03-10 NOTE — Assessment & Plan Note (Signed)
Doing well with famotidine '20mg'$  nightly

## 2022-03-10 NOTE — Assessment & Plan Note (Signed)
On supplements

## 2022-03-10 NOTE — Assessment & Plan Note (Signed)
I have personally reviewed the Medicare Annual Wellness questionnaire and have noted 1. The patient's medical and social history 2. Their use of alcohol, tobacco or illicit drugs 3. Their current medications and supplements 4. The patient's functional ability including ADL's, fall risks, home safety risks and hearing or visual             impairment. 5. Diet and physical activities 6. Evidence for depression or mood disorders  The patients weight, height, BMI and visual acuity have been recorded in the chart I have made referrals, counseling and provided education to the patient based review of the above and I have provided the pt with a written personalized care plan for preventive services.  I have provided you with a copy of your personalized plan for preventive services. Please take the time to review along with your updated medication list.  Will do FIT Needs at least one last mammogram now Flu vaccine today Updated COVID---maybe with new one Exercises regularly

## 2022-03-10 NOTE — Progress Notes (Signed)
Hearing Screening - Comments:: Has hearing aids. Wearing them today Vision Screening - Comments:: October 2023

## 2022-03-10 NOTE — Addendum Note (Signed)
Addended by: Pilar Grammes on: 03/10/2022 02:48 PM   Modules accepted: Orders

## 2022-03-10 NOTE — Progress Notes (Signed)
Subjective:    Patient ID: [name], female    DOB: 02-26-1945, 77 y.o.   MRN: [id number]  HPI Here for Medicare wellness visit and follow up of chronic health conditions Reviewed advanced directives Reviewed other doctors---Dr Smith--derm, Dr Karmen Stabs, Dr Warren Danes No surgery or hospitalizations in the past year Still exercises regularly ---has trainer that comes to her house and walks Vision is okay Hearing aides do help Rare alcohol No tobacco No falls No depression or anhedonia Independent with instrumental ADLs No memory problems  Heartburn has been controlled since started nightly famotidine Only occasional symptoms No dysphagia  Known high cholesterol  Still not interested in statin  Right trochanteric bursitis Discussed ice and diclofenac topical  Still takes B12 and D  Current Outpatient Medications on File Prior to Visit  Medication Sig Dispense Refill   brimonidine-timolol (COMBIGAN) 0.2-0.5 % ophthalmic solution Place 1 drop into both eyes every 12 (twelve) hours.     famotidine (PEPCID) 20 MG tablet Take 20 mg by mouth at bedtime.     vitamin B-12 (CYANOCOBALAMIN) 1000 MCG tablet Take 1,000 mcg by mouth daily.     No current facility-administered medications on file prior to visit.    No Known Allergies  Past Medical History:  Diagnosis Date   B12 deficiency    BPPV (benign paroxysmal positional vertigo)    Endometrial cancer ([place]) 2010   s/p hysterectomy   Gallbladder problem    Glaucoma    Dr Gaspar B[facility])   Neuropathy     Past Surgical History:  Procedure Laterality Date   ABDOMINAL HYSTERECTOMY  2010   Endometrial Ca   CHOLECYSTECTOMY  2010   OOPHORECTOMY Bilateral    TONSILLECTOMY      Family History  Problem Relation Age of Onset   Cancer Father        colon   Heart disease Neg Hx    Diabetes Neg Hx    Breast cancer Neg Hx     Social History   Socioeconomic History   Marital status: Single     Spouse name: Not on file   Number of children: 1   Years of education: Not on file   Highest education level: Not on file  Occupational History   Occupation: Radio producer    Comment: Retired  Tobacco Use   Smoking status: Never    Passive exposure: Never   Smokeless tobacco: Never  Vaping Use   Vaping Use: Never used  Substance and Sexual Activity   Alcohol use: Yes    Comment: Rarely   Drug use: No   Sexual activity: Not on file  Other Topics Concern   Not on file  Social History Narrative   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      Has living will   Has health care POA-----sister Tressie Stalker (or son--who lives in [place])   Would accept resuscitation   Wouldn't want tube feeds if cognitively unaware      Social Determinants of Health   Financial Resource Strain: Not on file  Food Insecurity: Not on file  Transportation Needs: Not on file  Physical Activity: Not on file  Stress: Not on file  Social Connections: Not on file  Intimate Partner Violence: Not on file   Review of Systems Appetite is fine Weight is stable Teeth are okay--keeps up with dentist Usually sleeps okay Wears seat belt No suspicious skin lesions Needed terbinafine for persistent rash and steroid creams--finally went away No chest  pain or SOB No decline in exercise tolerance No dizziness or syncope Bowels are fine--no blood No urinary problems No other back or joint pain    Objective:   Physical Exam Constitutional:      Appearance: Normal appearance.  HENT:     Mouth/Throat:     Comments: No lesions Eyes:     Conjunctiva/sclera: Conjunctivae normal.     Pupils: Pupils are equal, round, and reactive to light.  Cardiovascular:     Rate and Rhythm: Normal rate and regular rhythm.     Pulses: Normal pulses.     Heart sounds: No murmur heard.    No gallop.     Comments: Rare skips Pulmonary:     Effort: Pulmonary effort is normal.     Breath sounds: Normal breath  sounds. No wheezing or rales.  Abdominal:     Palpations: Abdomen is soft.     Tenderness: There is no abdominal tenderness.  Musculoskeletal:     Cervical back: Neck supple.     Right lower leg: No edema.     Left lower leg: No edema.     Comments: Mild tenderness right trochanteric bursa  Lymphadenopathy:     Cervical: No cervical adenopathy.  Skin:    Findings: No lesion or rash.  Neurological:     General: No focal deficit present.     Mental Status: She is alert and oriented to person, place, and time.     Comments: Word naming 13/1 minute Recall 3/3  Psychiatric:        Mood and Affect: Mood normal.        Behavior: Behavior normal.            Assessment & Plan:

## 2022-03-10 NOTE — Assessment & Plan Note (Signed)
Works with therapist Discussed ice and topical diclofenac

## 2022-03-11 ENCOUNTER — Encounter: Payer: Self-pay | Admitting: Internal Medicine

## 2022-03-11 LAB — COMPREHENSIVE METABOLIC PANEL
ALT: 13 U/L (ref 0–35)
AST: 20 U/L (ref 0–37)
Albumin: 4.2 g/dL (ref 3.5–5.2)
Alkaline Phosphatase: 81 U/L (ref 39–117)
BUN: 17 mg/dL (ref 6–23)
CO2: 28 mEq/L (ref 19–32)
Calcium: 9.2 mg/dL (ref 8.4–10.5)
Chloride: 104 mEq/L (ref 96–112)
Creatinine, Ser: 0.72 mg/dL (ref 0.40–1.20)
GFR: 81.75 mL/min (ref >60.00)
Glucose, Bld: 82 mg/dL (ref 70–99)
Potassium: 4.2 mEq/L (ref 3.5–5.1)
Sodium: 138 mEq/L (ref 135–145)
Total Bilirubin: 0.5 mg/dL (ref 0.2–1.2)
Total Protein: 7.1 g/dL (ref 6.0–8.3)

## 2022-03-11 LAB — CBC
HCT: 41.8 % (ref 36.0–46.0)
Hemoglobin: 14.3 g/dL (ref 12.0–15.0)
MCHC: 34.2 g/dL (ref 30.0–36.0)
MCV: 92 fl (ref 78.0–100.0)
Platelets: 258 10*3/uL (ref 150.0–400.0)
RBC: 4.54 Mil/uL (ref 3.87–5.11)
RDW: 13 % (ref 11.5–15.5)
WBC: 7.1 10*3/uL (ref 4.0–10.5)

## 2022-03-11 LAB — LIPID PANEL
Cholesterol: 232 mg/dL — ABNORMAL HIGH (ref 0–200)
HDL: 46.4 mg/dL (ref >39.00)
NonHDL: 185.51
Total CHOL/HDL Ratio: 5
Triglycerides: 218 mg/dL — ABNORMAL HIGH (ref 0.0–149.0)
VLDL: 43.6 mg/dL — ABNORMAL HIGH (ref 0.0–40.0)

## 2022-03-11 LAB — LDL CHOLESTEROL, DIRECT: Direct LDL: 141 mg/dL

## 2022-03-28 DIAGNOSIS — M25651 Stiffness of right hip, not elsewhere classified: Secondary | ICD-10-CM | POA: Diagnosis not present

## 2022-03-28 DIAGNOSIS — M25551 Pain in right hip: Secondary | ICD-10-CM | POA: Diagnosis not present

## 2022-03-29 ENCOUNTER — Other Ambulatory Visit: Payer: Self-pay | Admitting: *Deleted

## 2022-03-29 DIAGNOSIS — Z1211 Encounter for screening for malignant neoplasm of colon: Secondary | ICD-10-CM

## 2022-03-30 LAB — FECAL OCCULT BLOOD, IMMUNOCHEMICAL: Fecal Occult Bld: NEGATIVE

## 2022-04-06 DIAGNOSIS — H903 Sensorineural hearing loss, bilateral: Secondary | ICD-10-CM | POA: Diagnosis not present

## 2022-04-12 NOTE — Progress Notes (Signed)
Celester Morgan T. Latayna Ritchie, MD, [place] at [facility] [place], [address]  Phone: [phone]  FAX: [place] - 77 y.o. female  MRN [id number]  Date of Birth: [birth year]  Date: 04/13/2022  PCP: Venia Carbon, MD  Referral: Venia Carbon, MD  Chief Complaint  Patient presents with   Hip Pain    Right   Knee Pain    Right   Subjective:   [name] is a 77 y.o. very pleasant female patient with Body mass index is 34.93 kg/m. who presents with the following:  Very pleasant lady presents for evaluation of knee pain as well as hip pain.  I did see her 2 years ago for arthritis of the right knee.  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  At the time I saw her, clinically and radiographically she had mild to moderate knee osteoarthritis, and at that time she was having an exacerbation.  She is here today in follow-up regarding primarily her hip pain.  Right now, she is having a lot of pain in the lateral hip.  She has been working with physical therapy on her hip strength, but she continues to have persistent right-sided lateral pain.  It is pinpoint in the area of the trochanteric bursa.  With rotational movements, she also has some tenderness in the groin with internal range of motion.  This is less compared to the lateral hip pain.  Right now her knee is not really bothering her.  R GTB.  R hip is very weak in all directions Hip OA  Review of Systems is noted in the HPI, as appropriate  Objective:   BP (!) 128/90   Pulse 78   Temp 98.1 F (36.7 C) (Temporal)   Ht 5' 5.5" (1.664 m)   Wt 213 lb 2 oz (96.7 kg)   SpO2 98%   BMI 34.93 kg/m   GEN: No acute distress; alert,appropriate. PULM: Breathing comfortably in no respiratory distress PSYCH: Normally interactive.    HIP EXAM: SIDE: B ROM: Abduction, Flexion, Internal  and External range of motion: full ROM on the L.  EROM is better than expected on both hips.   Pain with terminal IROM and EROM: IROM on the R causes terminal pain GTB: TTP on the R SLR: NEG Knees: No effusion FABER: NT REVERSE FABER: NT, neg Piriformis: NT at direct palpation Str: flexion: 3+/5 abduction: 3/5 adduction: 3+/5 Strength testing somewhat tender    Laboratory and Imaging Data: DG HIP UNILAT WITH PELVIS 2-3 VIEWS RIGHT  Result Date: 04/15/2022 CLINICAL DATA:  Evaluate for osteoarthritis. EXAM: DG HIP (WITH OR WITHOUT PELVIS) 2-3V RIGHT COMPARISON:  Pelvis and right hip radiographs 09/11/2012 FINDINGS: There is mildly decreased bone mineralization. New moderate superomedial right femoroacetabular joint space narrowing, diffuse right superior acetabular subchondral sclerosis, mild superolateral acetabular osteophytosis, and right femoral head-neck junction mild circumferential degenerative osteophytes. No significant left femoroacetabular osteoarthritis on frontal view. The bilateral sacroiliac and pubic symphysis joint spaces are maintained. IMPRESSION: Compared to 09/11/2012: New moderate right femoroacetabular osteoarthritis. Electronically Signed   By: Yvonne Kendall M.D.   On: 04/15/2022 08:54     Assessment and Plan:     ICD-10-CM   1. Chronic right hip pain  M25.551 DG HIP UNILAT WITH PELVIS 2-3 VIEWS RIGHT   G89.29     2. Primary osteoarthritis of  right knee  M17.11     3. Primary localized osteoarthritis of hip  M16.10      Acute on chronic right-sided hip pain and osteoarthritis with exacerbation.  Right hip has become so weak, there is almost no way that she could walk and function normally without this recovering.  She is already working with physical therapy, and I also gave her a home rehab program to work on to work on her strength, walking, and balance.  She also does have some moderate osteoarthritis of the right hip, and this is certainly contributing to  her groin pain, which is relatively modest right now.  Overall, her range of motion in the hip is better than most people's.  Patient Instructions  Hip Rehab:  Hip Flexion: Toe up to ceiling, laying on your back. Lift your whole leg, 3 sets. Work up to being able to do #30 with each set.  Hip elevations, Toe and leg turned out to side.  Lift whole leg, 3 sets. Work up to being able to do #30 with each set.  Hip Abductions: Lying on side, straight out to side. 3 sets, work up to being able to do #30 with each set.  At the beginning you may only be able to do a lot less, try to do #10.    Medication Management during today's office visit: Meds ordered this encounter  Medications   celecoxib (CELEBREX) 200 MG capsule    Sig: Take 1 capsule (200 mg total) by mouth daily.    Dispense:  30 capsule    Refill:  2   There are no discontinued medications.  Orders placed today for conditions managed today: Orders Placed This Encounter  Procedures   DG HIP UNILAT WITH PELVIS 2-3 VIEWS RIGHT    Disposition: No follow-ups on file.  Dragon [employer] One speech-to-text software was used for transcription in this dictation.  Possible transcriptional errors can occur using Editor, commissioning.   Signed,  Maud Deed. Kenny Rea, MD   Outpatient Encounter Medications as of 04/13/2022  Medication Sig   brimonidine-timolol (COMBIGAN) 0.2-0.5 % ophthalmic solution Place 1 drop into both eyes every 12 (twelve) hours.   celecoxib (CELEBREX) 200 MG capsule Take 1 capsule (200 mg total) by mouth daily.   famotidine (PEPCID) 20 MG tablet Take 20 mg by mouth at bedtime.   vitamin B-12 (CYANOCOBALAMIN) 1000 MCG tablet Take 1,000 mcg by mouth daily.   No facility-administered encounter medications on file as of 04/13/2022.

## 2022-04-13 ENCOUNTER — Other Ambulatory Visit: Payer: Self-pay

## 2022-04-13 ENCOUNTER — Ambulatory Visit: Payer: Medicare PPO | Admitting: Family Medicine

## 2022-04-13 ENCOUNTER — Encounter: Payer: Self-pay | Admitting: Family Medicine

## 2022-04-13 ENCOUNTER — Ambulatory Visit (INDEPENDENT_AMBULATORY_CARE_PROVIDER_SITE_OTHER)
Admission: RE | Admit: 2022-04-13 | Discharge: 2022-04-13 | Disposition: A | Discharge status: Home / Self Care | Payer: Medicare PPO | Source: Ambulatory Visit | Attending: Family Medicine | Admitting: Family Medicine

## 2022-04-13 VITALS — BP 128/90 | HR 78 | Temp 98.1°F | Ht 65.5 in | Wt 213.1 lb

## 2022-04-13 DIAGNOSIS — G8929 Other chronic pain: Secondary | ICD-10-CM

## 2022-04-13 DIAGNOSIS — M25551 Pain in right hip: Secondary | ICD-10-CM

## 2022-04-13 DIAGNOSIS — M1611 Unilateral primary osteoarthritis, right hip: Secondary | ICD-10-CM | POA: Diagnosis not present

## 2022-04-13 DIAGNOSIS — M1610 Unilateral primary osteoarthritis, unspecified hip: Secondary | ICD-10-CM | POA: Diagnosis not present

## 2022-04-13 DIAGNOSIS — M1711 Unilateral primary osteoarthritis, right knee: Secondary | ICD-10-CM

## 2022-04-13 MED ORDER — CELECOXIB 200 MG PO CAPS
200.0000 mg | ORAL_CAPSULE | Freq: Every day | ORAL | 2 refills | Status: DC
  Filled 2022-04-13: qty 30, 30d supply, fill #0
  Filled 2022-05-10: qty 30, 30d supply, fill #1
  Filled 2022-10-10: qty 30, 30d supply, fill #2

## 2022-04-13 NOTE — Patient Instructions (Signed)
Hip Rehab:  Hip Flexion: Toe up to ceiling, laying on your back. Lift your whole leg, 3 sets. Work up to being able to do #30 with each set.  Hip elevations, Toe and leg turned out to side.  Lift whole leg, 3 sets. Work up to being able to do #30 with each set.  Hip Abductions: Lying on side, straight out to side. 3 sets, work up to being able to do #30 with each set.  At the beginning you may only be able to do a lot less, try to do #10.  

## 2022-04-15 ENCOUNTER — Encounter: Payer: Self-pay | Admitting: Family Medicine

## 2022-05-09 ENCOUNTER — Encounter: Payer: Self-pay | Admitting: Family Medicine

## 2022-05-16 DIAGNOSIS — H401111 Primary open-angle glaucoma, right eye, mild stage: Secondary | ICD-10-CM | POA: Diagnosis not present

## 2022-05-16 DIAGNOSIS — Z961 Presence of intraocular lens: Secondary | ICD-10-CM | POA: Diagnosis not present

## 2022-05-16 DIAGNOSIS — H401122 Primary open-angle glaucoma, left eye, moderate stage: Secondary | ICD-10-CM | POA: Diagnosis not present

## 2022-05-22 NOTE — Progress Notes (Unsigned)
    Glenford Garis T. Arlie Posch, MD, CAQ Sports Medicine [facility] at [facility] [address] [place], [address]  Phone: [phone]  FAX: [phone]  [name] - 77 y.o. female  MRN [id number]  Date of Birth: 1945-02-19  Date: 05/23/2022  PCP: Karie Schwalbe, MD  Referral: Karie Schwalbe, MD  No chief complaint on file.  Subjective:   [name] is a 77 y.o. very pleasant female patient with There is no height or weight on file to calculate BMI. who presents with the following:  She presents with follow-up right-sided hip pain and thigh pain.  She had been having some significant pain and dramatic weakness.  Last time I saw her I did do a recommendation of some hip strengthening, as her strength was roughly 3/5 in all directions on the right hip.  She does have moderate osteoarthritis on the right hip, as well.    Review of Systems is noted in the HPI, as appropriate  Objective:   There were no vitals taken for this visit.  GEN: No acute distress; alert,appropriate. PULM: Breathing comfortably in no respiratory distress PSYCH: Normally interactive.   Laboratory and Imaging Data:  Assessment and Plan:   ***

## 2022-05-23 ENCOUNTER — Encounter: Payer: Self-pay | Admitting: Family Medicine

## 2022-05-23 ENCOUNTER — Other Ambulatory Visit: Payer: Self-pay

## 2022-05-23 ENCOUNTER — Ambulatory Visit: Payer: Medicare PPO | Admitting: Family Medicine

## 2022-05-23 VITALS — BP 132/84 | HR 76 | Temp 97.7°F | Ht 65.5 in | Wt 216.2 lb

## 2022-05-23 DIAGNOSIS — M1610 Unilateral primary osteoarthritis, unspecified hip: Secondary | ICD-10-CM | POA: Diagnosis not present

## 2022-05-23 DIAGNOSIS — G8929 Other chronic pain: Secondary | ICD-10-CM | POA: Diagnosis not present

## 2022-05-23 DIAGNOSIS — M7061 Trochanteric bursitis, right hip: Secondary | ICD-10-CM | POA: Diagnosis not present

## 2022-05-23 DIAGNOSIS — M1711 Unilateral primary osteoarthritis, right knee: Secondary | ICD-10-CM

## 2022-05-23 DIAGNOSIS — M25551 Pain in right hip: Secondary | ICD-10-CM

## 2022-05-23 MED ORDER — PREDNISONE 20 MG PO TABS
ORAL_TABLET | ORAL | 0 refills | Status: AC
  Filled 2022-05-23: qty 15, 10d supply, fill #0

## 2022-05-23 MED ORDER — TRIAMCINOLONE ACETONIDE 40 MG/ML IJ SUSP
40.0000 mg | Freq: Once | INTRAMUSCULAR | Status: AC
  Administered 2022-05-23: 40 mg via INTRA_ARTICULAR

## 2022-06-08 ENCOUNTER — Other Ambulatory Visit: Payer: Self-pay

## 2022-06-08 MED ORDER — FAMOTIDINE 20 MG PO TABS
20.0000 mg | ORAL_TABLET | Freq: Every day | ORAL | 2 refills | Status: DC
  Filled 2022-06-08: qty 90, 90d supply, fill #0
  Filled 2022-10-10: qty 90, 90d supply, fill #1

## 2022-08-24 IMAGING — DX DG KNEE COMPLETE 4+V*R*
4 series · 4 of 4 positions shown · non-contrast
Comparison: None.

CLINICAL DATA: Right knee pain

EXAM:
RIGHT KNEE - COMPLETE 4+ VIEW

[knee ap]
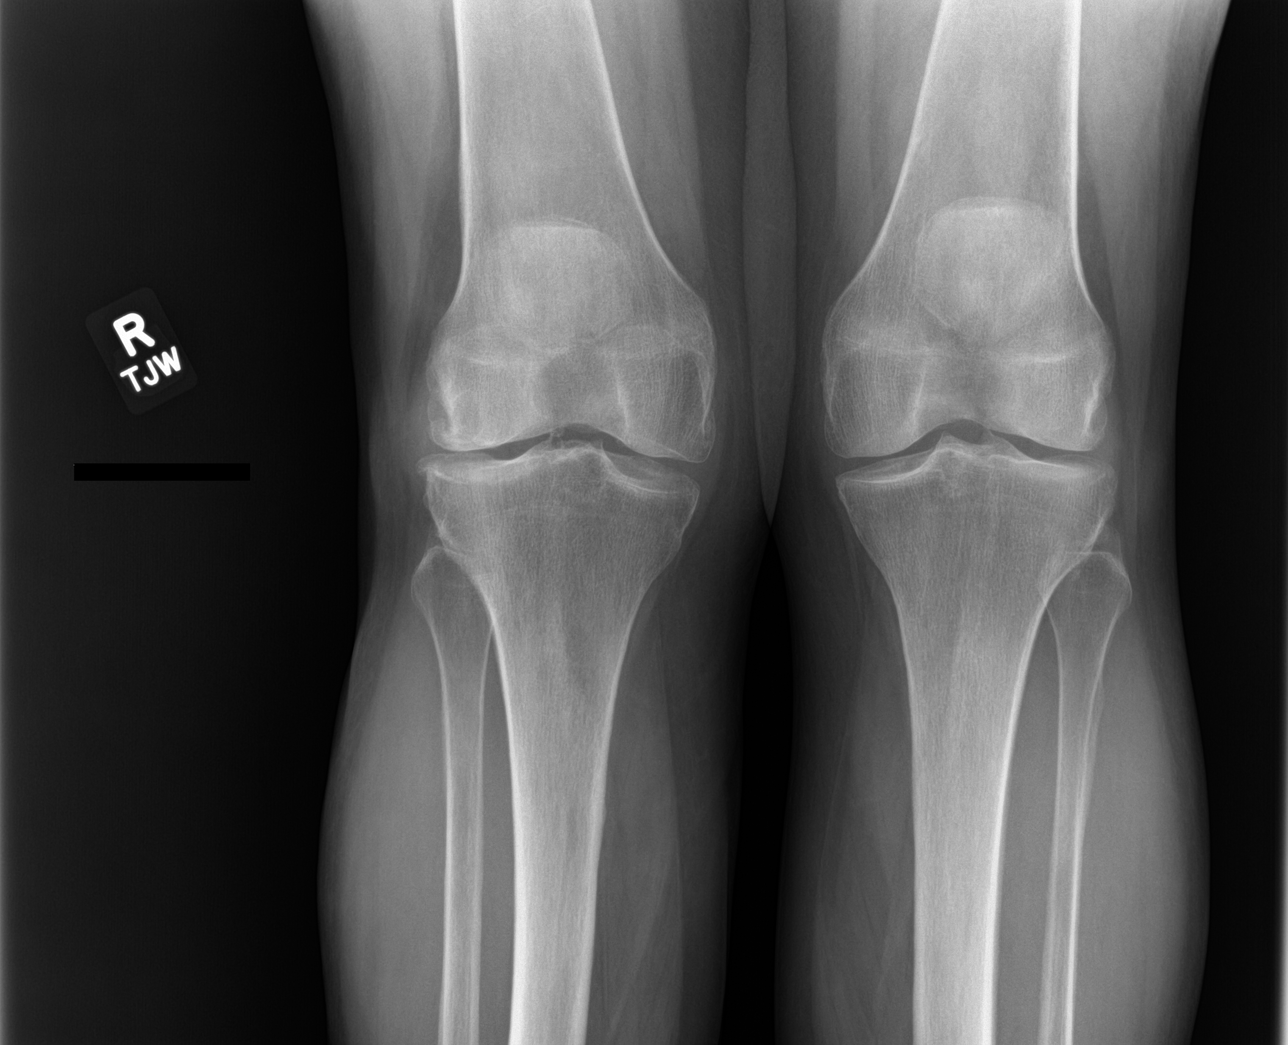

[knee tunnel]
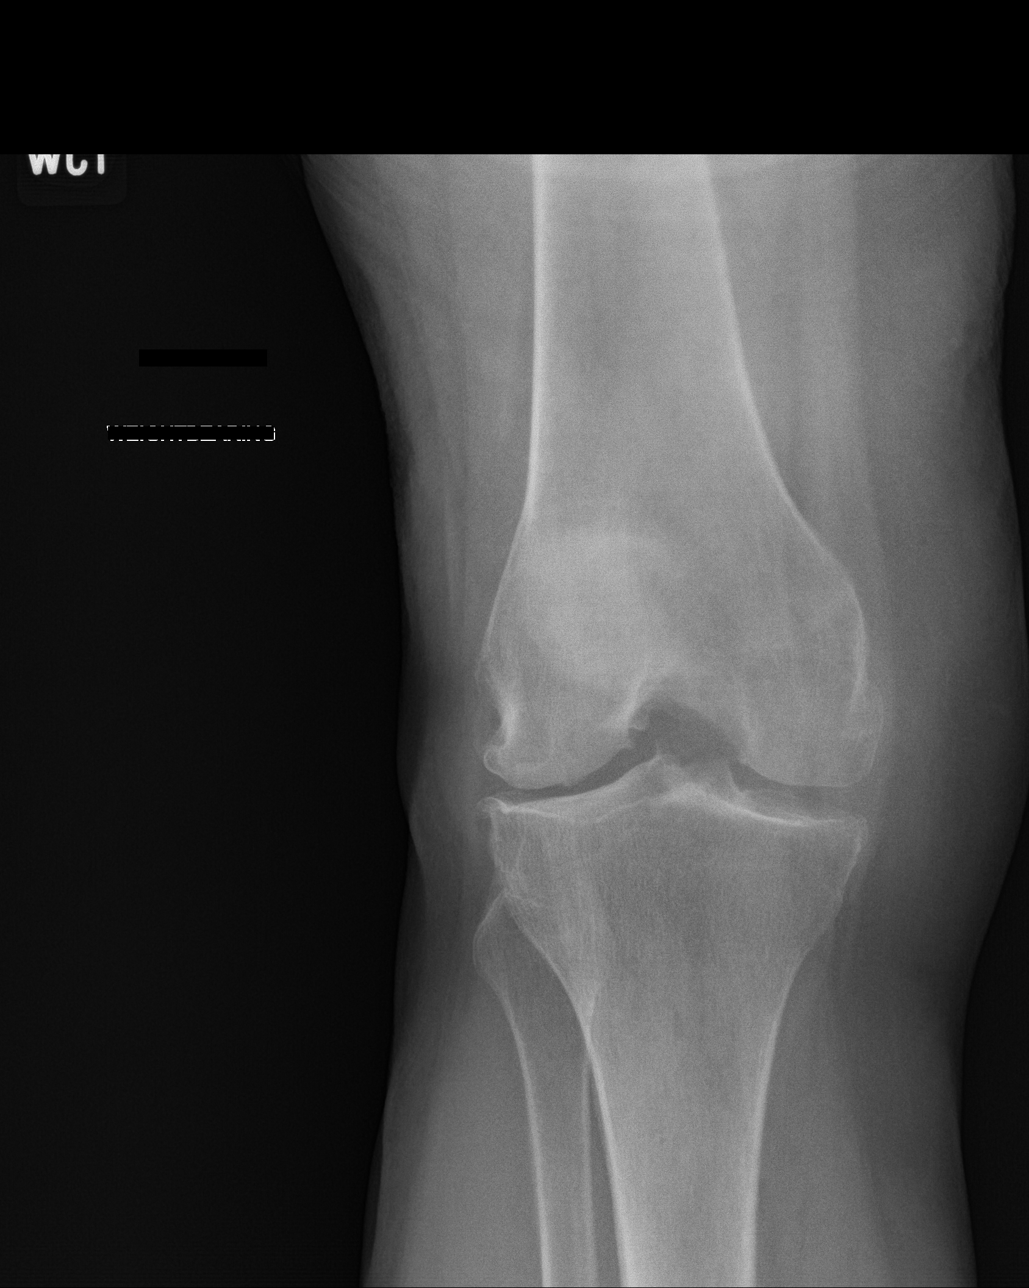

[knee lat]
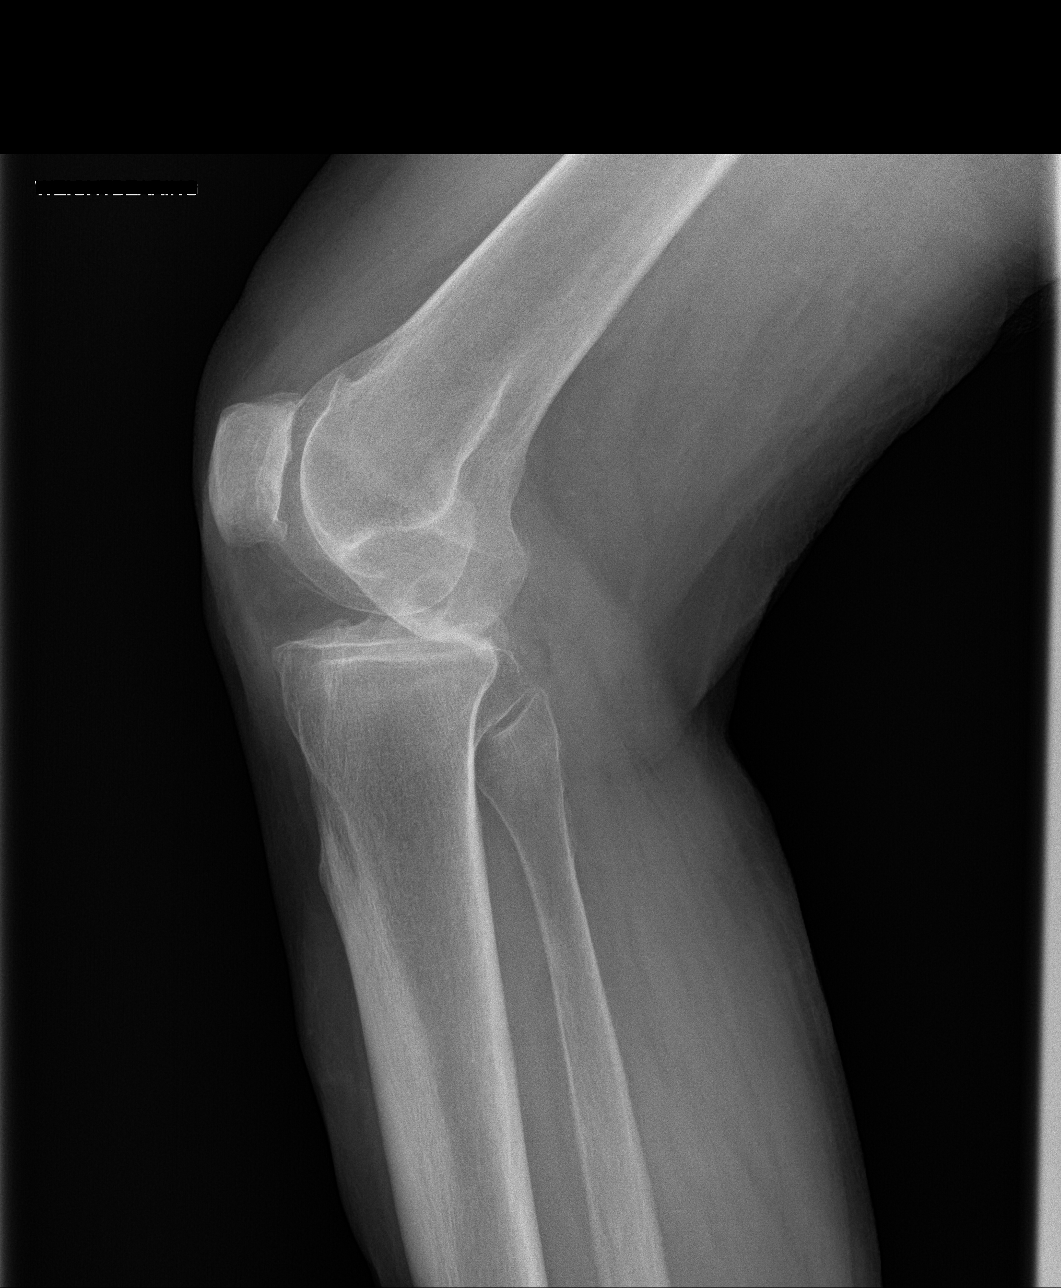

[patella skyline]
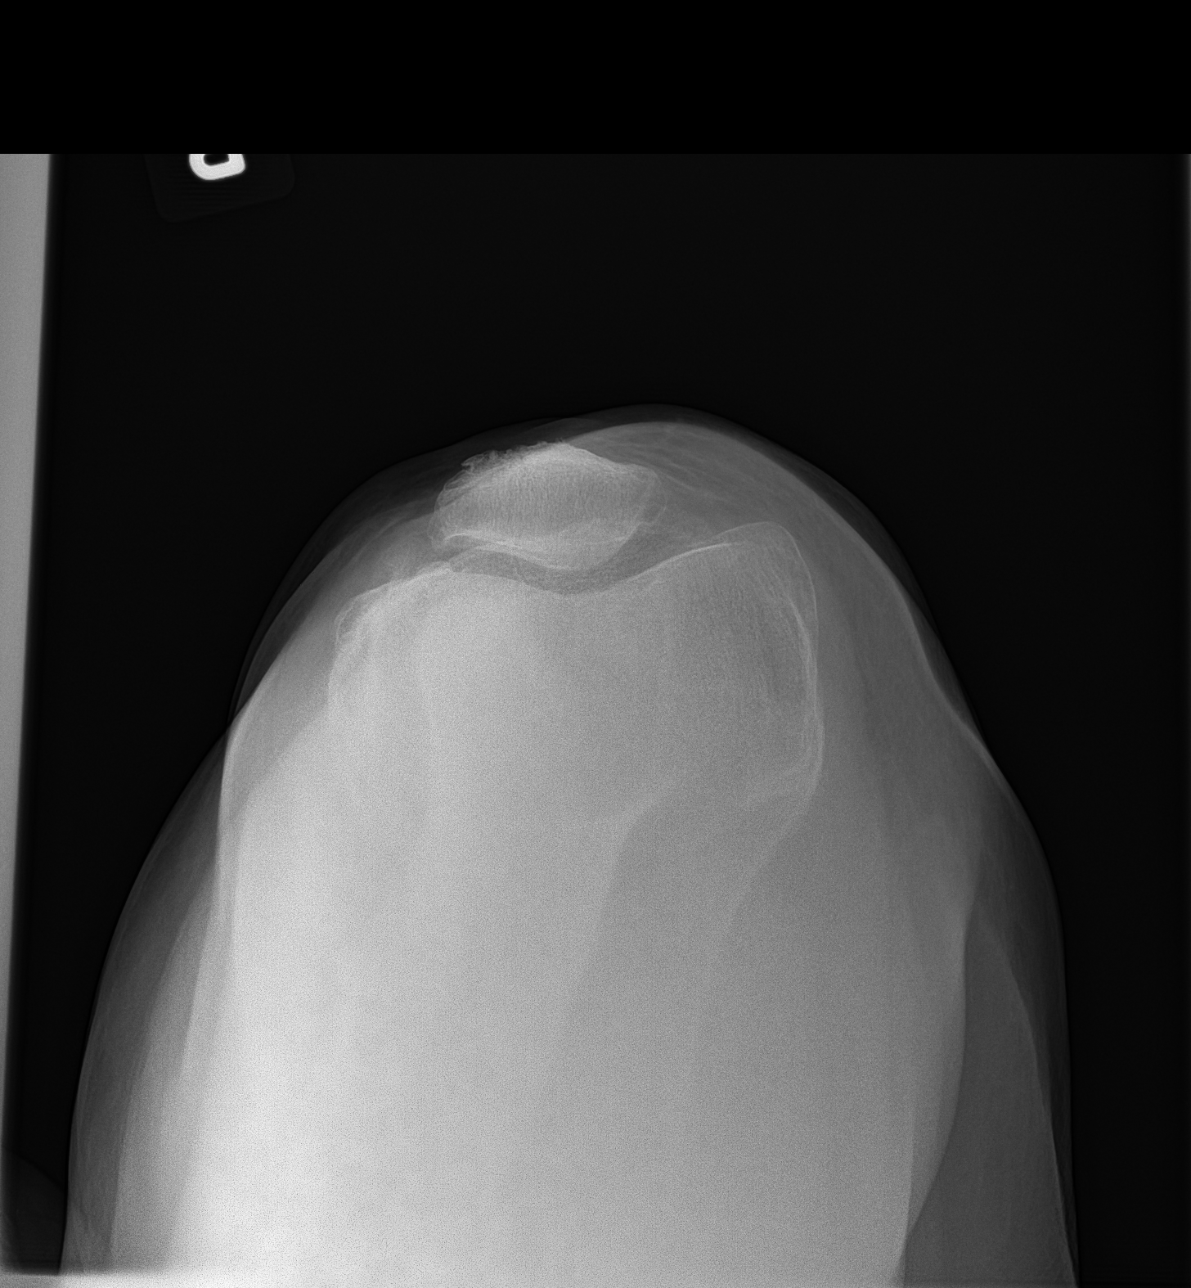

[4 of 4 positions shown; findings below may reference images not displayed]

FINDINGS: No acute fracture or dislocation. Mild-to-moderate osteoarthritis in
the lateral and patellofemoral compartments. No significant knee
joint effusion. Soft tissues within normal limits.
IMPRESSION: Mild to moderate osteoarthritis of the right knee.

## 2022-10-06 ENCOUNTER — Ambulatory Visit: Payer: Medicare PPO | Admitting: Family Medicine

## 2022-10-06 ENCOUNTER — Encounter: Payer: Self-pay | Admitting: Family Medicine

## 2022-10-06 VITALS — BP 138/82 | HR 84 | Temp 99.6°F | Ht 65.5 in | Wt 214.4 lb

## 2022-10-06 DIAGNOSIS — M25562 Pain in left knee: Secondary | ICD-10-CM

## 2022-10-06 NOTE — Patient Instructions (Signed)
Treadmill: Calf and posterior lower extremity rehab  Forward Walking: Go light 2 mins, easy about 2-3 mph: At Sideways Left: 2 mins, 0.6 - 0.8 mph Sideways Right: 2 mins, 0.6 - 0.8 mph Backwards, 2 mins, 1.8 - 2.2 mph Repeat, several cycles Goal is 30 minute  Start at a 3 degree incline - can slightly lower if necessary When able, increase to 3.5, then 4, then 4.5 (After you comfortably can do 30 minutes)

## 2022-10-06 NOTE — Progress Notes (Signed)
Shelby Cegielski T. Mose Colaizzi, MD, CAQ Sports Medicine [facility] at [facility] [address] [place], [address]  Phone: [phone]  FAX: [phone]  [name] - 77 y.o. female  MRN [id number]  Date of Birth: 03-18-1945  Date: 10/06/2022  PCP: Karie Schwalbe, MD  Referral: Karie Schwalbe, MD  Chief Complaint  Patient presents with   Knee Pain    Left-Hurt while doing an exercise yesterday   Subjective:   [name] is a 77 y.o. very pleasant female patient with Body mass index is 35.13 kg/m. who presents with the following:  Patient presents with some ongoing acute L knee pain.  She had some pain and felt the pop when she was doing some exercise yesterday.  She has been doing some hard walking.   Her knee started to hurt when she was warming up and doing some stretches Lateral and to the posterior of the fibular head - tried some rest so far There is no significant swelling, bruising, or effusion. No dislocation  No prior major knee injuries in the past.  She does have some arthritis at baseline.  L posterior knee Popliteus vs popliteal fibular ligament Treadmill rehab  Review of Systems is noted in the HPI, as appropriate  Objective:   BP 138/82 (BP Location: Right Arm, Patient Position: Sitting, Cuff Size: Large)   Pulse 84   Temp 99.6 F (37.6 C) (Temporal)   Ht 5' 5.5" (1.664 m)   Wt 214 lb 6 oz (97.2 kg)   SpO2 98%   BMI 35.13 kg/m   GEN: No acute distress; alert,appropriate. PULM: Breathing comfortably in no respiratory distress PSYCH: Normally interactive.    Knee:  L Gait: Normal heel toe pattern ROM: 0-125 Effusion: neg Echymosis or edema: none Patellar tendon NT Painful PLICA: neg Patellar grind: negative Medial and lateral patellar facet loading: negative medial and lateral joint lines: -Not truly lateral joint line tenderness, she does have pain in the lateral knee just inferior to the joint line and  about the fibular head.  Some posterolateral pain. Mcmurray's neg Flexion-pinch neg Varus and valgus stress: stable Lachman: neg Ant and Post drawer: neg Hip abduction, IR, ER: WNL Hip flexion str: 5/5 Hip abd: 5/5 Quad: 5/5 VMO atrophy:No Hamstring concentric and eccentric: 5/5   Laboratory and Imaging Data:  Assessment and Plan:     ICD-10-CM   1. Acute pain of left knee  M25.562      Acute left knee pain from yesterday seems to isolate to the popliteus o the popliteal fibular ligament.  I think she will probably do fine with time and basic rehab.  I am going to have her do a treadmill rehab protocol.  Patient Instructions  Treadmill: Calf and posterior lower extremity rehab  Forward Walking: Go light 2 mins, easy about 2-3 mph: At Sideways Left: 2 mins, 0.6 - 0.8 mph Sideways Right: 2 mins, 0.6 - 0.8 mph Backwards, 2 mins, 1.8 - 2.2 mph Repeat, several cycles Goal is 30 minute  Start at a 3 degree incline - can slightly lower if necessary When able, increase to 3.5, then 4, then 4.5 (After you comfortably can do 30 minutes)    Medication Management during today's office visit: No orders of the defined types were placed in this encounter.  Medications Discontinued During This Encounter  Medication Reason   famotidine (PEPCID) 20 MG tablet Duplicate    Orders placed today for conditions managed today: No  orders of the defined types were placed in this encounter.   Disposition: No follow-ups on file.  Dragon [employer] One speech-to-text software was used for transcription in this dictation.  Possible transcriptional errors can occur using Animal nutritionist.   Signed,  Elpidio Galea. Heston Widener, MD   Outpatient Encounter Medications as of 10/06/2022  Medication Sig   brimonidine-timolol (COMBIGAN) 0.2-0.5 % ophthalmic solution Place 1 drop into both eyes every 12 (twelve) hours.   celecoxib (CELEBREX) 200 MG capsule Take 1 capsule (200 mg total) by mouth daily.    famotidine (PEPCID) 20 MG tablet Take 1 tablet (20 mg total) by mouth at bedtime.   vitamin B-12 (CYANOCOBALAMIN) 1000 MCG tablet Take 1,000 mcg by mouth daily.   [DISCONTINUED] famotidine (PEPCID) 20 MG tablet Take 20 mg by mouth at bedtime.   No facility-administered encounter medications on file as of 10/06/2022.

## 2022-10-10 ENCOUNTER — Other Ambulatory Visit: Payer: Self-pay

## 2022-11-14 DIAGNOSIS — H401111 Primary open-angle glaucoma, right eye, mild stage: Secondary | ICD-10-CM | POA: Diagnosis not present

## 2023-01-08 ENCOUNTER — Encounter: Payer: Self-pay | Admitting: Internal Medicine

## 2023-01-23 ENCOUNTER — Other Ambulatory Visit: Payer: Self-pay | Admitting: Internal Medicine

## 2023-01-23 ENCOUNTER — Other Ambulatory Visit: Payer: Self-pay

## 2023-01-23 MED ORDER — FAMOTIDINE 20 MG PO TABS
20.0000 mg | ORAL_TABLET | Freq: Every day | ORAL | 3 refills | Status: DC
  Filled 2023-01-23: qty 90, 90d supply, fill #0
  Filled 2023-05-02: qty 90, 90d supply, fill #1
  Filled 2023-09-06: qty 90, 90d supply, fill #2
  Filled 2023-12-20: qty 90, 90d supply, fill #3

## 2023-02-27 DIAGNOSIS — H903 Sensorineural hearing loss, bilateral: Secondary | ICD-10-CM | POA: Diagnosis not present

## 2023-03-14 ENCOUNTER — Encounter: Payer: Medicare PPO | Admitting: Internal Medicine

## 2023-03-20 DIAGNOSIS — H401122 Primary open-angle glaucoma, left eye, moderate stage: Secondary | ICD-10-CM | POA: Diagnosis not present

## 2023-03-20 DIAGNOSIS — Z961 Presence of intraocular lens: Secondary | ICD-10-CM | POA: Diagnosis not present

## 2023-03-20 DIAGNOSIS — H401111 Primary open-angle glaucoma, right eye, mild stage: Secondary | ICD-10-CM | POA: Diagnosis not present

## 2023-05-01 DIAGNOSIS — L821 Other seborrheic keratosis: Secondary | ICD-10-CM | POA: Diagnosis not present

## 2023-05-01 DIAGNOSIS — L219 Seborrheic dermatitis, unspecified: Secondary | ICD-10-CM | POA: Diagnosis not present

## 2023-05-02 ENCOUNTER — Other Ambulatory Visit: Payer: Self-pay

## 2023-06-15 ENCOUNTER — Ambulatory Visit

## 2023-06-15 VITALS — BP 138/82 | Ht 65.5 in | Wt 212.0 lb

## 2023-06-15 DIAGNOSIS — Z0000 Encounter for general adult medical examination without abnormal findings: Secondary | ICD-10-CM

## 2023-06-15 NOTE — Progress Notes (Signed)
 Because this visit was a virtual/telehealth visit,  certain criteria was not obtained, such a blood pressure, CBG if applicable, and timed get up and go. Any medications not marked as "taking" were not mentioned during the medication reconciliation part of the visit. Any vitals not documented were not able to be obtained due to this being a telehealth visit or patient was unable to self-report a recent blood pressure reading due to a lack of equipment at home via telehealth. Vitals that have been documented are verbally provided by the patient.  Subjective:   [name] is a 78 y.o. who presents for a Medicare Wellness preventive visit.  Visit Complete: Virtual I connected with  [name] on 06/15/23 by a video and audio enabled telemedicine application and verified that I am speaking with the correct person using two identifiers.  Patient Location: Home  Provider Location: Home Office  I discussed the limitations of evaluation and management by telemedicine. The patient expressed understanding and agreed to proceed.  Vital Signs: Because this visit was a virtual/telehealth visit, some criteria may be missing or patient reported. Any vitals not documented were not able to be obtained and vitals that have been documented are patient reported.  Persons Participating in Visit: Patient.  AWV Questionnaire: No: Patient Medicare AWV questionnaire was not completed prior to this visit.  Cardiac Risk Factors include: advanced age (>27men, >56 women);sedentary lifestyle;obesity (BMI >30kg/m2);dyslipidemia     Objective:    Today's Vitals   06/15/23 1548  BP: 138/82  Weight: 212 lb (96.2 kg)  Height: 5' 5.5" (1.664 m)   Body mass index is 34.74 kg/m.     06/15/2023    3:47 PM 06/06/2014    1:16 PM  Advanced Directives  Does Patient Have a Medical Advance Directive? Yes No  Type of Estate agent of [place];Living will   Does patient want to make changes to  medical advance directive? No - Patient declined   Copy of Healthcare Power of Attorney in Chart? No - copy requested   Would patient like information on creating a medical advance directive?  Yes - Educational materials given    Current Medications (verified) Outpatient Encounter Medications as of 06/15/2023  Medication Sig   brimonidine-timolol (COMBIGAN) 0.2-0.5 % ophthalmic solution Place 1 drop into both eyes every 12 (twelve) hours.   famotidine  (PEPCID ) 20 MG tablet Take 1 tablet (20 mg total) by mouth at bedtime.   vitamin B-12 (CYANOCOBALAMIN ) 1000 MCG tablet Take 1,000 mcg by mouth daily.   celecoxib  (CELEBREX ) 200 MG capsule Take 1 capsule (200 mg total) by mouth daily.   No facility-administered encounter medications on file as of 06/15/2023.    Allergies (verified) Patient has no known allergies.   History: Past Medical History:  Diagnosis Date   B12 deficiency    BPPV (benign paroxysmal positional vertigo)    Endometrial cancer (HCC) 2010   s/p hysterectomy   Gallbladder problem    Glaucoma    Dr Geraldean Klein [facility])   Neuropathy    Past Surgical History:  Procedure Laterality Date   ABDOMINAL HYSTERECTOMY  2010   Endometrial Ca   CHOLECYSTECTOMY  2010   OOPHORECTOMY Bilateral    TONSILLECTOMY     Family History  Problem Relation Age of Onset   Cancer Father        colon   Heart disease Neg Hx    Diabetes Neg Hx    Breast cancer Neg Hx    Social  History   Socioeconomic History   Marital status: Single    Spouse name: Not on file   Number of children: 1   Years of education: Not on file   Highest education level: Bachelor's degree (e.g., BA, AB, BS)  Occupational History   Occupation: Chartered loss adjuster    Comment: Retired  Tobacco Use   Smoking status: Never    Passive exposure: Never   Smokeless tobacco: Never  Vaping Use   Vaping status: Never Used  Substance and Sexual Activity   Alcohol use: Yes    Comment: Rarely   Drug use: No   Sexual  activity: Not on file  Other Topics Concern   Not on file  Social History Narrative   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      Has living will   Has health care POA-----sister [name] (or son--who lives in [place])   Would accept resuscitation   Wouldn't want tube feeds if cognitively unaware      Social Drivers of Health   Financial Resource Strain: Low Risk  (06/08/2023)   Overall Financial Resource Strain (CARDIA)    Difficulty of Paying Living Expenses: Not hard at all  Food Insecurity: No Food Insecurity (06/08/2023)   Hunger Vital Sign    Worried About Running Out of Food in the Last Year: Never true    Ran Out of Food in the Last Year: Never true  Transportation Needs: No Transportation Needs (06/08/2023)   PRAPARE - Administrator, Civil Service (Medical): No    Lack of Transportation (Non-Medical): No  Physical Activity: Sufficiently Active (06/08/2023)   Exercise Vital Sign    Days of Exercise per Week: 6 days    Minutes of Exercise per Session: 30 min  Stress: No Stress Concern Present (06/08/2023)   [employer] of Occupational Health - Occupational Stress Questionnaire    Feeling of Stress : Only a little  Social Connections: Unknown (06/08/2023)   Social Connection and Isolation Panel [NHANES]    Frequency of Communication with Friends and Family: More than three times a week    Frequency of Social Gatherings with Friends and Family: Twice a week    Attends Religious Services: Never    Database administrator or Organizations: No    Attends Engineer, structural: Not on file    Marital Status: Patient declined    Tobacco Counseling Counseling given: Not Answered    Clinical Intake:  Pre-visit preparation completed: Yes  Pain : No/denies pain     BMI - recorded: 34.74 Nutritional Status: BMI > 30  Obese Nutritional Risks: None Diabetes: No  No results found for: "HGBA1C"   How often do you need to have someone  help you when you read instructions, pamphlets, or other written materials from your doctor or pharmacy?: 1 - Never What is the last grade level you completed in school?: 4 years college  Interpreter Needed?: No  Information entered by :: Juliann Ochoa   Activities of Daily Living     06/15/2023    3:51 PM  In your present state of health, do you have any difficulty performing the following activities:  Hearing? 1  Comment wears hearing aid  Vision? 0  Difficulty concentrating or making decisions? 0  Walking or climbing stairs? 0  Dressing or bathing? 0  Doing errands, shopping? 0  Preparing Food and eating ? N  Using the Toilet? N  In the past six months, have you accidently  leaked urine? N  Do you have problems with loss of bowel control? N  Managing your Medications? N  Managing your Finances? N  Housekeeping or managing your Housekeeping? N    Patient Care Team: Helaine Llanos, MD as PCP - General (Internal Medicine)  Indicate any recent Medical Services you may have received from other than [facility] providers in the past year (date may be approximate).     Assessment:   This is a routine wellness examination for Khrystina.  Hearing/Vision screen Hearing Screening - Comments:: Wears hearing aids Vision Screening - Comments:: Patient has no vision issues   Goals Addressed             This Visit's Progress    Patient Stated       To get to [place]        Depression Screen     06/15/2023    3:53 PM 03/10/2022    2:32 PM 03/10/2022    2:07 PM 03/09/2021   11:54 AM 11/22/2019   10:46 AM 11/22/2019   10:09 AM 06/19/2019    9:39 AM  PHQ 2/9 Scores  PHQ - 2 Score 0 0 0 0 0 0 5  PHQ- 9 Score 0      13    Fall Risk     06/15/2023    3:51 PM 03/10/2022    2:32 PM 03/10/2022    2:06 PM 03/06/2022    7:51 PM 03/09/2021   11:54 AM  Fall Risk   Falls in the past year? 0 0 0 0 0  Number falls in past yr: 0  0 0   Injury with Fall? 0  0    Risk for fall due to : No  Fall Risks  No Fall Risks    Follow up Falls prevention discussed;Falls evaluation completed  Falls evaluation completed      MEDICARE RISK AT HOME:  Medicare Risk at Home Any stairs in or around the home?: Yes If so, are there any without handrails?: No Home free of loose throw rugs in walkways, pet beds, electrical cords, etc?: Yes Adequate lighting in your home to reduce risk of falls?: Yes Life alert?: No Use of a cane, walker or w/c?: No Grab bars in the bathroom?: Yes Shower chair or bench in shower?: Yes Elevated toilet seat or a handicapped toilet?: Yes  TIMED UP AND GO:  Was the test performed?  No  Cognitive Function: 6CIT completed    06/06/2014    1:24 PM  MMSE - Mini Mental State Exam  Orientation to time 5  Orientation to Place 5  Registration 3  Attention/ Calculation 5  Recall 3  Language- name 2 objects 2  Language- repeat 1  Language- follow 3 step command 3  Language- read & follow direction 1  Write a sentence 1  Copy design 1  Total score 30        06/15/2023    3:49 PM  6CIT Screen  What Year? 0 points  What month? 0 points  What time? 0 points  Count back from 20 0 points  Months in reverse 0 points  Repeat phrase 0 points  Total Score 0 points    Immunizations Immunization History  Administered Date(s) Administered   Fluad Quad(high Dose 65+) 11/19/2018, 11/22/2019, 03/10/2022   Influenza Split 02/04/2014, 01/05/2023   Influenza, High Dose Seasonal PF 11/08/2016, 11/16/2020   Influenza,inj,Quad PF,6+ Mos 11/17/2015, 11/13/2017   Influenza-Unspecified 12/09/2014   Moderna Covid-19 Fall  Seasonal Vaccine 57yrs & older 01/05/2023   PFIZER(Purple Top)SARS-COV-2 Vaccination 03/08/2019, 03/26/2019, 12/11/2019   Pfizer Covid-19 Vaccine Bivalent Booster 22yrs & up 11/16/2020   Pneumococcal Conjugate-13 06/06/2014   Pneumococcal Polysaccharide-23 12/02/2011, 11/13/2017   Tdap 02/04/2014   Zoster Recombinant(Shingrix) 12/06/2016, 04/11/2017     Screening Tests Health Maintenance  Topic Date Due   Hepatitis C Screening  Never done   COVID-19 Vaccine (6 - Pfizer risk 2024-25 season) 07/05/2023   INFLUENZA VACCINE  09/15/2023   DTaP/Tdap/Td (2 - Td or Tdap) 02/05/2024   Medicare Annual Wellness (AWV)  06/14/2024   Pneumonia Vaccine 36+ Years old  Completed   DEXA SCAN  Completed   Zoster Vaccines- Shingrix  Completed   HPV VACCINES  Aged Out   Meningococcal B Vaccine  Aged Out   Colonoscopy  Discontinued    Health Maintenance  Health Maintenance Due  Topic Date Due   Hepatitis C Screening  Never done   Health Maintenance Items Addressed:   Additional Screening:  Vision Screening: Recommended annual ophthalmology exams for early detection of glaucoma and other disorders of the eye.  Dental Screening: Recommended annual dental exams for proper oral hygiene  Community Resource Referral / Chronic Care Management: CRR required this visit?  No   CCM required this visit?  No     Plan:     I have personally reviewed and noted the following in the patient's chart:   Medical and social history Use of alcohol, tobacco or illicit drugs  Current medications and supplements including opioid prescriptions. Patient is not currently taking opioid prescriptions. Functional ability and status Nutritional status Physical activity Advanced directives List of other physicians Hospitalizations, surgeries, and ER visits in previous 12 months Vitals Screenings to include cognitive, depression, and falls Referrals and appointments  In addition, I have reviewed and discussed with patient certain preventive protocols, quality metrics, and best practice recommendations. A written personalized care plan for preventive services as well as general preventive health recommendations were provided to patient.     Freeda Jerry, [place]   06/15/2023   After Visit Summary: (MyChart) Due to this being a telephonic visit, the after  visit summary with patients personalized plan was offered to patient via MyChart   Notes: Nothing significant to report at this time.

## 2023-06-15 NOTE — Patient Instructions (Signed)
 [name] , Thank you for taking time to come for your Medicare Wellness Visit. I appreciate your ongoing commitment to your health goals. Please review the following plan we discussed and let me know if I can assist you in the future.   Referrals/Orders/Follow-Ups/Clinician Recommendations: follow up as scheduled.  This is a list of the screening recommended for you and due dates:  Health Maintenance  Topic Date Due   Hepatitis C Screening  Never done   COVID-19 Vaccine (6 - Pfizer risk 2024-25 season) 07/05/2023   Flu Shot  09/15/2023   DTaP/Tdap/Td vaccine (2 - Td or Tdap) 02/05/2024   Medicare Annual Wellness Visit  06/14/2024   Pneumonia Vaccine  Completed   DEXA scan (bone density measurement)  Completed   Zoster (Shingles) Vaccine  Completed   HPV Vaccine  Aged Out   Meningitis B Vaccine  Aged Out   Colon Cancer Screening  Discontinued    Advanced directives: (Copy Requested) Please bring a copy of your health care power of attorney and living will to the office to be added to your chart at your convenience. You can mail to [facility] [address] [place], [place] [address] or email to [email or web address]  Next Medicare Annual Wellness Visit scheduled for next year: Yes  Have you seen your provider in the last 6 months (3 months if uncontrolled diabetes)? Yes

## 2023-06-16 ENCOUNTER — Ambulatory Visit (INDEPENDENT_AMBULATORY_CARE_PROVIDER_SITE_OTHER): Admitting: Internal Medicine

## 2023-06-16 ENCOUNTER — Encounter: Payer: Self-pay | Admitting: Internal Medicine

## 2023-06-16 VITALS — BP 138/88 | HR 71 | Temp 98.4°F | Ht 66.0 in | Wt 212.0 lb

## 2023-06-16 DIAGNOSIS — K219 Gastro-esophageal reflux disease without esophagitis: Secondary | ICD-10-CM

## 2023-06-16 DIAGNOSIS — E785 Hyperlipidemia, unspecified: Secondary | ICD-10-CM

## 2023-06-16 DIAGNOSIS — M159 Polyosteoarthritis, unspecified: Secondary | ICD-10-CM | POA: Insufficient documentation

## 2023-06-16 DIAGNOSIS — Z1211 Encounter for screening for malignant neoplasm of colon: Secondary | ICD-10-CM

## 2023-06-16 DIAGNOSIS — Z0000 Encounter for general adult medical examination without abnormal findings: Secondary | ICD-10-CM

## 2023-06-16 DIAGNOSIS — M150 Primary generalized (osteo)arthritis: Secondary | ICD-10-CM | POA: Diagnosis not present

## 2023-06-16 DIAGNOSIS — Z1159 Encounter for screening for other viral diseases: Secondary | ICD-10-CM | POA: Diagnosis not present

## 2023-06-16 LAB — COMPREHENSIVE METABOLIC PANEL WITH GFR
ALT: 19 U/L (ref 0–35)
AST: 22 U/L (ref 0–37)
Albumin: 4.3 g/dL (ref 3.5–5.2)
Alkaline Phosphatase: 84 U/L (ref 39–117)
BUN: 20 mg/dL (ref 6–23)
CO2: 29 meq/L (ref 19–32)
Calcium: 9.3 mg/dL (ref 8.4–10.5)
Chloride: 104 meq/L (ref 96–112)
Creatinine, Ser: 0.87 mg/dL (ref 0.40–1.20)
GFR: 64.56 mL/min (ref >60.00)
Glucose, Bld: 96 mg/dL (ref 70–99)
Potassium: 4.4 meq/L (ref 3.5–5.1)
Sodium: 140 meq/L (ref 135–145)
Total Bilirubin: 0.6 mg/dL (ref 0.2–1.2)
Total Protein: 6.8 g/dL (ref 6.0–8.3)

## 2023-06-16 LAB — LIPID PANEL
Cholesterol: 232 mg/dL — ABNORMAL HIGH (ref 0–200)
HDL: 44.1 mg/dL (ref >39.00)
LDL Cholesterol: 149 mg/dL — ABNORMAL HIGH (ref 0–99)
NonHDL: 187.61
Total CHOL/HDL Ratio: 5
Triglycerides: 192 mg/dL — ABNORMAL HIGH (ref 0.0–149.0)
VLDL: 38.4 mg/dL (ref 0.0–40.0)

## 2023-06-16 NOTE — Assessment & Plan Note (Signed)
 Mild symptoms Uses the pepcid  about twice a week

## 2023-06-16 NOTE — Assessment & Plan Note (Signed)
 Healthy Working on fitness Will do FIT again Urged her to get one last mammogram RSV any time now Flu/COVID updates in the fall

## 2023-06-16 NOTE — Assessment & Plan Note (Signed)
 Chronic Still not excited about primary prevention with statin

## 2023-06-16 NOTE — Assessment & Plan Note (Signed)
 Uses topical diclofenac and ibuprofen 400mg  rarely Occ injections from Dr Geralyn Knee

## 2023-06-16 NOTE — Progress Notes (Signed)
 Subjective:    Patient ID: [name], female    DOB: 1945-11-11, 78 y.o.   MRN: [id number]  HPI Here for physical  Doing well Ongoing arthritis issues--right knee and hip mostly Still sees Dr Geralyn Knee as needed --has had some injections Works with trainer/PT twice a week Advil occasionally helps--400mg   Also uses diclofenac  Uses pepcid  prn No dysphagia  Current Outpatient Medications on File Prior to Visit  Medication Sig Dispense Refill   brimonidine-timolol (COMBIGAN) 0.2-0.5 % ophthalmic solution Place 1 drop into both eyes every 12 (twelve) hours.     famotidine  (PEPCID ) 20 MG tablet Take 1 tablet (20 mg total) by mouth at bedtime. 90 tablet 3   vitamin B-12 (CYANOCOBALAMIN ) 1000 MCG tablet Take 1,000 mcg by mouth daily.     No current facility-administered medications on file prior to visit.    No Known Allergies  Past Medical History:  Diagnosis Date   B12 deficiency    BPPV (benign paroxysmal positional vertigo)    Endometrial cancer (HCC) 2010   s/p hysterectomy   Gallbladder problem    Glaucoma    Dr Geraldean Klein [facility])   Neuropathy     Past Surgical History:  Procedure Laterality Date   ABDOMINAL HYSTERECTOMY  2010   Endometrial Ca   CHOLECYSTECTOMY  2010   OOPHORECTOMY Bilateral    TONSILLECTOMY      Family History  Problem Relation Age of Onset   Cancer Father        colon   Heart disease Neg Hx    Diabetes Neg Hx    Breast cancer Neg Hx     Social History   Socioeconomic History   Marital status: Single    Spouse name: Not on file   Number of children: 1   Years of education: Not on file   Highest education level: Bachelor's degree (e.g., BA, AB, BS)  Occupational History   Occupation: Chartered loss adjuster    Comment: Retired  Tobacco Use   Smoking status: Never    Passive exposure: Never   Smokeless tobacco: Never  Vaping Use   Vaping status: Never Used  Substance and Sexual Activity   Alcohol use: Yes    Comment: Rarely    Drug use: No   Sexual activity: Not on file  Other Topics Concern   Not on file  Social History Narrative   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      Has living will   Has health care POA-----sister Wendie Hamburg (or son--who lives in [place])   Would accept resuscitation   Wouldn't want tube feeds if cognitively unaware      Social Drivers of Health   Financial Resource Strain: Low Risk  (06/08/2023)   Overall Financial Resource Strain (CARDIA)    Difficulty of Paying Living Expenses: Not hard at all  Food Insecurity: No Food Insecurity (06/08/2023)   Hunger Vital Sign    Worried About Running Out of Food in the Last Year: Never true    Ran Out of Food in the Last Year: Never true  Transportation Needs: No Transportation Needs (06/08/2023)   PRAPARE - Administrator, Civil Service (Medical): No    Lack of Transportation (Non-Medical): No  Physical Activity: Sufficiently Active (06/08/2023)   Exercise Vital Sign    Days of Exercise per Week: 6 days    Minutes of Exercise per Session: 30 min  Stress: No Stress Concern Present (06/08/2023)   [employer] of Occupational  Health - Occupational Stress Questionnaire    Feeling of Stress : Only a little  Social Connections: Unknown (06/08/2023)   Social Connection and Isolation Panel [NHANES]    Frequency of Communication with Friends and Family: More than three times a week    Frequency of Social Gatherings with Friends and Family: Twice a week    Attends Religious Services: Never    Database administrator or Organizations: No    Attends Engineer, structural: Not on file    Marital Status: Patient declined  Intimate Partner Violence: Not on file   Review of Systems  Constitutional:  Negative for fatigue and unexpected weight change.       Wears seat belt  HENT:  Negative for dental problem, tinnitus and trouble swallowing.        Hearing aides help Keeps up with dentist  Eyes:  Negative for visual  disturbance.       No diplopia or unilateral vision loss  Respiratory:  Negative for cough, chest tightness and shortness of breath.   Cardiovascular:  Negative for chest pain, palpitations and leg swelling.  Gastrointestinal:  Negative for blood in stool and constipation.  Endocrine: Negative for polydipsia and polyuria.  Genitourinary:  Negative for dysuria and hematuria.  Musculoskeletal:  Positive for arthralgias. Negative for back pain and joint swelling.  Skin:  Negative for rash.       No suspicious skin lesions  Allergic/Immunologic: Negative for environmental allergies and immunocompromised state.  Neurological:  Negative for dizziness, syncope, light-headedness and headaches.  Hematological:  Negative for adenopathy. Does not bruise/bleed easily.  Psychiatric/Behavioral:  Negative for dysphoric mood and sleep disturbance. The patient is not nervous/anxious.        Objective:   Physical Exam Constitutional:      Appearance: Normal appearance.  HENT:     Mouth/Throat:     Pharynx: No oropharyngeal exudate or posterior oropharyngeal erythema.  Eyes:     Conjunctiva/sclera: Conjunctivae normal.     Pupils: Pupils are equal, round, and reactive to light.  Cardiovascular:     Rate and Rhythm: Normal rate and regular rhythm.     Pulses: Normal pulses.     Heart sounds: No murmur heard.    No gallop.  Pulmonary:     Effort: Pulmonary effort is normal.     Breath sounds: Normal breath sounds. No wheezing or rales.  Abdominal:     Palpations: Abdomen is soft.     Tenderness: There is no abdominal tenderness.  Musculoskeletal:     Cervical back: Neck supple.     Right lower leg: No edema.     Left lower leg: No edema.  Lymphadenopathy:     Cervical: No cervical adenopathy.  Skin:    Findings: No rash.  Neurological:     General: No focal deficit present.     Mental Status: She is alert and oriented to person, place, and time.  Psychiatric:        Mood and Affect: Mood  normal.        Behavior: Behavior normal.            Assessment & Plan:

## 2023-06-17 LAB — HEPATITIS C ANTIBODY: Hepatitis C Ab: NONREACTIVE

## 2023-06-18 LAB — CBC
HCT: 43.8 % (ref 36.0–46.0)
Hemoglobin: 14.6 g/dL (ref 12.0–15.0)
MCHC: 33.3 g/dL (ref 30.0–36.0)
MCV: 92.2 fl (ref 78.0–100.0)
Platelets: 259 10*3/uL (ref 150.0–400.0)
RBC: 4.75 Mil/uL (ref 3.87–5.11)
RDW: 12.7 % (ref 11.5–15.5)
WBC: 5.4 10*3/uL (ref 4.0–10.5)

## 2023-06-19 ENCOUNTER — Other Ambulatory Visit (INDEPENDENT_AMBULATORY_CARE_PROVIDER_SITE_OTHER): Payer: Self-pay

## 2023-06-19 ENCOUNTER — Encounter: Payer: Self-pay | Admitting: Internal Medicine

## 2023-06-19 DIAGNOSIS — Z1211 Encounter for screening for malignant neoplasm of colon: Secondary | ICD-10-CM

## 2023-06-20 LAB — FECAL OCCULT BLOOD, IMMUNOCHEMICAL: Fecal Occult Bld: NEGATIVE

## 2023-06-21 ENCOUNTER — Encounter: Payer: Self-pay | Admitting: Internal Medicine

## 2023-07-17 DIAGNOSIS — L219 Seborrheic dermatitis, unspecified: Secondary | ICD-10-CM | POA: Diagnosis not present

## 2023-09-06 ENCOUNTER — Other Ambulatory Visit: Payer: Self-pay

## 2023-09-21 DIAGNOSIS — H401111 Primary open-angle glaucoma, right eye, mild stage: Secondary | ICD-10-CM | POA: Diagnosis not present

## 2023-09-21 DIAGNOSIS — H401122 Primary open-angle glaucoma, left eye, moderate stage: Secondary | ICD-10-CM | POA: Diagnosis not present

## 2023-10-02 DIAGNOSIS — D492 Neoplasm of unspecified behavior of bone, soft tissue, and skin: Secondary | ICD-10-CM | POA: Diagnosis not present

## 2023-10-02 DIAGNOSIS — B079 Viral wart, unspecified: Secondary | ICD-10-CM | POA: Diagnosis not present

## 2023-10-02 DIAGNOSIS — L820 Inflamed seborrheic keratosis: Secondary | ICD-10-CM | POA: Diagnosis not present

## 2023-10-05 DIAGNOSIS — H903 Sensorineural hearing loss, bilateral: Secondary | ICD-10-CM | POA: Diagnosis not present

## 2023-11-08 DIAGNOSIS — M1611 Unilateral primary osteoarthritis, right hip: Secondary | ICD-10-CM | POA: Diagnosis not present

## 2023-11-08 DIAGNOSIS — M1711 Unilateral primary osteoarthritis, right knee: Secondary | ICD-10-CM | POA: Diagnosis not present

## 2024-02-25 ENCOUNTER — Other Ambulatory Visit: Payer: Self-pay

## 2024-02-26 ENCOUNTER — Other Ambulatory Visit: Payer: Self-pay

## 2024-02-27 ENCOUNTER — Other Ambulatory Visit: Payer: Self-pay

## 2024-02-27 ENCOUNTER — Other Ambulatory Visit: Payer: Self-pay | Admitting: Family Medicine

## 2024-02-27 MED FILL — Famotidine Tab 20 MG: ORAL | 90 days supply | Qty: 90 | Fill #0 | Status: AC

## 2024-06-18 ENCOUNTER — Ambulatory Visit

## 2024-06-21 ENCOUNTER — Ambulatory Visit

## 2024-06-24 ENCOUNTER — Encounter
